# Patient Record
Sex: Male | Born: 1970 | Race: White | Hispanic: No | Marital: Married | Smoking: Never smoker
Health system: Southern US, Community
[De-identification: ages and names within clinical notes are randomized; demographics above are authoritative.]

## PROBLEM LIST (undated history)

## (undated) DIAGNOSIS — T4145XA Adverse effect of unspecified anesthetic, initial encounter: Secondary | ICD-10-CM

## (undated) DIAGNOSIS — K219 Gastro-esophageal reflux disease without esophagitis: Secondary | ICD-10-CM

## (undated) DIAGNOSIS — R51 Headache: Secondary | ICD-10-CM

## (undated) DIAGNOSIS — R519 Headache, unspecified: Secondary | ICD-10-CM

## (undated) DIAGNOSIS — T8859XA Other complications of anesthesia, initial encounter: Secondary | ICD-10-CM

## (undated) HISTORY — PX: ANKLE ARTHODESIS W/ ARTHROSCOPY: SUR63

## (undated) HISTORY — PX: KNEE ARTHROCENTESIS: SUR44

---

## 2005-10-04 ENCOUNTER — Other Ambulatory Visit: Payer: Self-pay

## 2005-10-04 ENCOUNTER — Inpatient Hospital Stay: Payer: Self-pay | Admitting: Internal Medicine

## 2005-10-05 ENCOUNTER — Other Ambulatory Visit: Payer: Self-pay

## 2008-02-22 ENCOUNTER — Emergency Department: Payer: Self-pay | Admitting: Emergency Medicine

## 2008-02-26 ENCOUNTER — Ambulatory Visit: Payer: Self-pay | Admitting: Orthopedic Surgery

## 2008-02-26 ENCOUNTER — Other Ambulatory Visit: Payer: Self-pay

## 2008-02-28 ENCOUNTER — Ambulatory Visit: Payer: Self-pay | Admitting: Orthopedic Surgery

## 2008-02-29 ENCOUNTER — Emergency Department: Payer: Self-pay | Admitting: Emergency Medicine

## 2010-09-23 ENCOUNTER — Ambulatory Visit: Payer: Self-pay | Admitting: Internal Medicine

## 2012-03-16 ENCOUNTER — Ambulatory Visit (HOSPITAL_BASED_OUTPATIENT_CLINIC_OR_DEPARTMENT_OTHER)
Admission: RE | Admit: 2012-03-16 | Discharge: 2012-03-16 | Disposition: A | Discharge status: Home / Self Care | Payer: Self-pay | Source: Ambulatory Visit | Attending: Occupational Medicine | Admitting: Occupational Medicine

## 2012-03-16 ENCOUNTER — Other Ambulatory Visit: Payer: Self-pay | Admitting: Occupational Medicine

## 2012-03-16 DIAGNOSIS — Z0000 Encounter for general adult medical examination without abnormal findings: Secondary | ICD-10-CM | POA: Insufficient documentation

## 2012-07-17 ENCOUNTER — Ambulatory Visit: Payer: Self-pay | Admitting: Internal Medicine

## 2012-07-26 ENCOUNTER — Ambulatory Visit: Payer: Self-pay

## 2014-12-31 ENCOUNTER — Encounter: Payer: Self-pay | Admitting: Emergency Medicine

## 2014-12-31 ENCOUNTER — Ambulatory Visit
Admission: EM | Admit: 2014-12-31 | Discharge: 2014-12-31 | Disposition: A | Discharge status: Home / Self Care | Payer: Commercial Indemnity | Attending: Family Medicine | Admitting: Family Medicine

## 2014-12-31 ENCOUNTER — Ambulatory Visit
Admit: 2014-12-31 | Discharge: 2014-12-31 | Disposition: A | Discharge status: Home / Self Care | Payer: Commercial Indemnity | Attending: Family Medicine | Admitting: Family Medicine

## 2014-12-31 ENCOUNTER — Ambulatory Visit: Payer: Commercial Indemnity

## 2014-12-31 DIAGNOSIS — T148 Other injury of unspecified body region: Secondary | ICD-10-CM

## 2014-12-31 DIAGNOSIS — M79605 Pain in left leg: Secondary | ICD-10-CM | POA: Insufficient documentation

## 2014-12-31 DIAGNOSIS — Z23 Encounter for immunization: Secondary | ICD-10-CM

## 2014-12-31 DIAGNOSIS — T148XXA Other injury of unspecified body region, initial encounter: Secondary | ICD-10-CM

## 2014-12-31 MED ORDER — TETANUS-DIPHTH-ACELL PERTUSSIS 5-2.5-18.5 LF-MCG/0.5 IM SUSP
0.5000 mL | Freq: Once | INTRAMUSCULAR | Status: AC
  Administered 2014-12-31: 0.5 mL via INTRAMUSCULAR

## 2014-12-31 NOTE — ED Notes (Signed)
Patient states " he hit his left lower leg with the pressure washer about 3 weeks ago leaving a large painful knot on his left lower leg which has not gotten any better"

## 2014-12-31 NOTE — ED Provider Notes (Signed)
Patient presents today with symptoms of hematoma to the left lower shin area after pressure washer hit the area approximately 3 weeks ago. Patient states that he is always on his feet and has not had time to rest her elevate the area much. He has noticed the area looking larger and is still slightly tender. He denies any of the skin overlying the area being warm or erythematous. He denies any fever or chills. He has noticed that distal to the area along the ankle he has had some swelling but no pain. He also has had occasional knee and hip pain which are chronic for him but wonders whether the recent pain is a result of the injury to the left shin area. Denies any tingling or numbness, discoloration of the extremity.   Review systems negative except mentioned above Vitals as per chart  GENERAL: NAD RESP: CTA B CARD: RRR  EXTREM: LLE-no erythema or significant warmth of the area, no streaks, approx. 2 in x 2 in area of swelling with a small pinpoint scab in the center, mild tenderness along this area in the mid-distal shin area, FROM, nv intact, minimal swelling around the ankle compared to other side, no knee swelling or tenderness appreciated, -Homans  NEURO: CN II-XII groslly intact   A/P: LLE Contusion/Hematoma-x-ray was done of the area without any evidence of fracture, will do ultrasound to rule out any DVT, patient's distal swelling is likely due to gravity which was explained to the patient, recommend patient ice area and elevated as much as possible if ultrasound is negative for DVT. Will discuss results of ultrasound once I've been called by the radiology tech. A TDAP immunization was  given to the patient prior to discharge from the office here today. He is to seek medical attention if symptoms persist or worsen as discussed.  Paulina Fusi, MD 12/31/14 1530

## 2014-12-31 NOTE — Discharge Instructions (Signed)
Go get ultrasound at this time. If no blood clot recommend more ice on area and elevation of leg. If symptoms continue to persist or worsen seek medical attention. If any signs of infection seek medical attention.

## 2015-01-19 ENCOUNTER — Other Ambulatory Visit: Payer: Self-pay | Admitting: Family Medicine

## 2015-01-19 ENCOUNTER — Encounter: Payer: Self-pay | Admitting: Family Medicine

## 2015-01-19 ENCOUNTER — Ambulatory Visit (INDEPENDENT_AMBULATORY_CARE_PROVIDER_SITE_OTHER): Payer: Commercial Indemnity | Admitting: Family Medicine

## 2015-01-19 ENCOUNTER — Ambulatory Visit
Admission: RE | Admit: 2015-01-19 | Discharge: 2015-01-19 | Disposition: A | Discharge status: Home / Self Care | Payer: Commercial Indemnity | Source: Ambulatory Visit | Attending: Family Medicine | Admitting: Family Medicine

## 2015-01-19 VITALS — BP 122/90 | HR 70 | Ht 69.0 in | Wt 225.0 lb

## 2015-01-19 DIAGNOSIS — M25552 Pain in left hip: Secondary | ICD-10-CM

## 2015-01-19 DIAGNOSIS — M25572 Pain in left ankle and joints of left foot: Secondary | ICD-10-CM | POA: Diagnosis not present

## 2015-01-19 DIAGNOSIS — J0100 Acute maxillary sinusitis, unspecified: Secondary | ICD-10-CM | POA: Diagnosis not present

## 2015-01-19 DIAGNOSIS — G8929 Other chronic pain: Secondary | ICD-10-CM

## 2015-01-19 MED ORDER — AMOXICILLIN-POT CLAVULANATE 875-125 MG PO TABS: 1.0000 | ORAL_TABLET | Freq: Two times a day (BID) | ORAL | Status: DC

## 2015-01-19 MED ORDER — MELOXICAM 15 MG PO TABS: 15.0000 mg | ORAL_TABLET | Freq: Every day | ORAL | Status: DC

## 2015-01-19 NOTE — Progress Notes (Signed)
Name: Michael Wu   MRN: [id number]    DOB: Sep 13, 1970   Date:01/19/2015       Progress Note  Subjective  Chief Complaint  Chief Complaint  Patient presents with  . Sinusitis    clear, thick production    Sinusitis This is a new problem. The current episode started 1 to 4 weeks ago. The problem has been gradually improving since onset. There has been no fever. Associated symptoms include coughing. Pertinent negatives include no chills, congestion, diaphoresis, ear pain, headaches, hoarse voice, neck pain, shortness of breath, sinus pressure, sneezing, sore throat or swollen glands. Past treatments include nothing. The treatment provided mild relief.  Hip Pain  The incident occurred more than 1 week ago. The incident occurred at home. There was no injury mechanism. The pain is present in the left hip. The quality of the pain is described as aching. The pain is moderate. The pain has been intermittent since onset. Pertinent negatives include no inability to bear weight, loss of motion, loss of sensation, muscle weakness, numbness or tingling. The symptoms are aggravated by movement. He has tried acetaminophen and NSAIDs for the symptoms. The treatment provided mild relief.    No problem-specific assessment & plan notes found for this encounter.   History reviewed. No pertinent past medical history.  Past Surgical History  Procedure Laterality Date  . Ankle arthodesis w/ arthroscopy Left   . Knee arthrocentesis Left     Family History  Problem Relation Age of Onset  . Cancer Father   . Heart disease Father     History   Social History  . Marital Status: Married    Spouse Name: N/A  . Number of Children: N/A  . Years of Education: N/A   Occupational History  . Not on file.   Social History Main Topics  . Smoking status: Never Smoker   . Smokeless tobacco: Current User  . Alcohol Use: 0.6 oz/week    1 Cans of beer per week  . Drug Use: Not on file  . Sexual Activity:  Yes   Other Topics Concern  . Not on file   Social History Narrative    No Known Allergies   Review of Systems  Constitutional: Negative for fever, chills, weight loss, malaise/fatigue and diaphoresis.  HENT: Negative for congestion, ear discharge, ear pain, hoarse voice, sinus pressure, sneezing and sore throat.   Eyes: Negative for blurred vision.  Respiratory: Positive for cough. Negative for sputum production, shortness of breath and wheezing.   Cardiovascular: Negative for chest pain, palpitations and leg swelling.  Gastrointestinal: Negative for heartburn, nausea, abdominal pain, diarrhea, constipation, blood in stool and melena.  Genitourinary: Negative for dysuria, urgency, frequency and hematuria.  Musculoskeletal: Negative for myalgias, back pain, joint pain and neck pain.  Skin: Negative for rash.  Neurological: Negative for dizziness, tingling, sensory change, focal weakness, numbness and headaches.  Endo/Heme/Allergies: Negative for environmental allergies and polydipsia. Does not bruise/bleed easily.  Psychiatric/Behavioral: Negative for depression and suicidal ideas. The patient is not nervous/anxious and does not have insomnia.      Objective  Filed Vitals:   01/19/15 1347  BP: 122/90  Pulse: 70  Height: 5\' 9"  (1.753 m)  Weight: 225 lb (102.059 kg)    Physical Exam  Constitutional: He is oriented to person, place, and time and well-developed, well-nourished, and in no distress.  HENT:  Head: Normocephalic.  Right Ear: External ear normal.  Left Ear: External ear normal.  Nose: Nose normal.  Mouth/Throat: Oropharynx is clear and moist.  Eyes: Conjunctivae and EOM are normal. Pupils are equal, round, and reactive to light. Right eye exhibits no discharge. Left eye exhibits no discharge. No scleral icterus.  Neck: Normal range of motion. Neck supple. No JVD present. No tracheal deviation present. No thyromegaly present.  Cardiovascular: Normal rate, regular  rhythm, normal heart sounds and intact distal pulses.  Exam reveals no gallop and no friction rub.   No murmur heard. Pulmonary/Chest: Breath sounds normal. No respiratory distress. He has no wheezes. He has no rales.  Abdominal: Soft. Bowel sounds are normal. He exhibits no mass. There is no hepatosplenomegaly. There is no tenderness. There is no rebound, no guarding and no CVA tenderness.  Musculoskeletal: Normal range of motion. He exhibits no edema or tenderness.  Lymphadenopathy:    He has no cervical adenopathy.  Neurological: He is alert and oriented to person, place, and time. He has normal sensation, normal strength, normal reflexes and intact cranial nerves. No cranial nerve deficit.  Skin: Skin is warm. No rash noted.  Psychiatric: Mood and affect normal.      Assessment & Plan  Problem List Items Addressed This Visit    None    Visit Diagnoses    Acute maxillary sinusitis, recurrence not specified    -  Primary    Relevant Medications    amoxicillin-clavulanate (AUGMENTIN) 875-125 MG per tablet    Hip pain, left        Relevant Medications    meloxicam (MOBIC) 15 MG tablet    Other Relevant Orders    DG HIP UNILAT WITH PELVIS 1V LEFT    Ankle pain, chronic, left        Relevant Medications    meloxicam (MOBIC) 15 MG tablet    Other Relevant Orders    DG Ankle Complete Left         Dr. Deanna Jones [facility]  01/19/2015

## 2015-01-22 ENCOUNTER — Other Ambulatory Visit: Payer: Self-pay

## 2015-01-22 DIAGNOSIS — M25572 Pain in left ankle and joints of left foot: Principal | ICD-10-CM

## 2015-01-22 DIAGNOSIS — G8929 Other chronic pain: Secondary | ICD-10-CM

## 2015-02-23 ENCOUNTER — Encounter: Payer: Self-pay | Admitting: Family Medicine

## 2015-02-23 ENCOUNTER — Ambulatory Visit (INDEPENDENT_AMBULATORY_CARE_PROVIDER_SITE_OTHER): Payer: Commercial Indemnity | Admitting: Family Medicine

## 2015-02-23 VITALS — BP 130/80 | HR 80 | Ht 69.0 in | Wt 225.0 lb

## 2015-02-23 DIAGNOSIS — J45909 Unspecified asthma, uncomplicated: Secondary | ICD-10-CM

## 2015-02-23 DIAGNOSIS — R05 Cough: Secondary | ICD-10-CM | POA: Diagnosis not present

## 2015-02-23 DIAGNOSIS — R0982 Postnasal drip: Secondary | ICD-10-CM

## 2015-02-23 DIAGNOSIS — R054 Cough syncope: Secondary | ICD-10-CM | POA: Insufficient documentation

## 2015-02-23 DIAGNOSIS — J309 Allergic rhinitis, unspecified: Secondary | ICD-10-CM

## 2015-02-23 MED ORDER — MONTELUKAST SODIUM 10 MG PO TABS: 10.0000 mg | ORAL_TABLET | Freq: Every day | ORAL | Status: DC

## 2015-02-23 NOTE — Progress Notes (Signed)
Name: Michael Wu   MRN: [id number]    DOB: 1970-11-12   Date:02/23/2015       Progress Note  Subjective  Chief Complaint  Chief Complaint  Patient presents with  . Cough    finished round of Augmentin on 01/29/15    Cough This is a recurrent problem. The current episode started 1 to 4 weeks ago. The problem has been gradually improving (one episode of syncopy). The cough is non-productive. Associated symptoms include nasal congestion, postnasal drip and rhinorrhea. Pertinent negatives include no chest pain, chills, ear congestion, ear pain, fever, headaches, heartburn, hemoptysis, myalgias, rash, sore throat, shortness of breath, sweats, weight loss or wheezing. Nothing aggravates the symptoms. There is no history of asthma, bronchiectasis, bronchitis, COPD, emphysema, environmental allergies or pneumonia.    No problem-specific assessment & plan notes found for this encounter.   No past medical history on file.  Past Surgical History  Procedure Laterality Date  . Ankle arthodesis w/ arthroscopy Left   . Knee arthrocentesis Left     Family History  Problem Relation Age of Onset  . Cancer Father   . Heart disease Father     Social History   Social History  . Marital Status: Married    Spouse Name: N/A  . Number of Children: N/A  . Years of Education: N/A   Occupational History  . Not on file.   Social History Main Topics  . Smoking status: Never Smoker   . Smokeless tobacco: Current User  . Alcohol Use: 0.6 oz/week    1 Cans of beer per week  . Drug Use: Not on file  . Sexual Activity: Yes   Other Topics Concern  . Not on file   Social History Narrative    No Known Allergies   Review of Systems  Constitutional: Negative for fever, chills and weight loss.  HENT: Positive for postnasal drip and rhinorrhea. Negative for ear pain and sore throat.   Respiratory: Positive for cough. Negative for hemoptysis, shortness of breath and wheezing.    Cardiovascular: Negative for chest pain.  Gastrointestinal: Negative for heartburn.  Musculoskeletal: Negative for myalgias.  Skin: Negative for rash.  Neurological: Negative for headaches.  Endo/Heme/Allergies: Negative for environmental allergies.     Objective  Filed Vitals:   02/23/15 1352  BP: 130/80  Pulse: 80  Height: 5\' 9"  (1.753 m)  Weight: 225 lb (102.059 kg)    Physical Exam    Assessment & Plan  Problem List Items Addressed This Visit      Cardiovascular and Mediastinum   Cough syncope syndrome   Relevant Orders   Ambulatory referral to ENT    Other Visit Diagnoses    Allergic rhinitis with postnasal drip    -  Primary    Relevant Medications    montelukast (SINGULAIR) 10 MG tablet    Other Relevant Orders    Ambulatory referral to ENT         Dr. Otilio Miu [facility]  02/23/2015

## 2015-11-19 ENCOUNTER — Encounter: Payer: Self-pay | Admitting: Family Medicine

## 2015-11-19 ENCOUNTER — Ambulatory Visit (INDEPENDENT_AMBULATORY_CARE_PROVIDER_SITE_OTHER): Payer: Commercial Indemnity | Admitting: Family Medicine

## 2015-11-19 VITALS — BP 120/80 | HR 82 | Ht 69.0 in | Wt 223.0 lb

## 2015-11-19 DIAGNOSIS — S8391XS Sprain of unspecified site of right knee, sequela: Secondary | ICD-10-CM | POA: Diagnosis not present

## 2015-11-19 DIAGNOSIS — S838X1S Sprain of other specified parts of right knee, sequela: Secondary | ICD-10-CM

## 2015-11-19 DIAGNOSIS — M175 Other unilateral secondary osteoarthritis of knee: Secondary | ICD-10-CM | POA: Diagnosis not present

## 2015-11-19 MED ORDER — MELOXICAM 15 MG PO TABS: 15.0000 mg | ORAL_TABLET | Freq: Every day | ORAL | Status: DC

## 2015-11-19 NOTE — Progress Notes (Signed)
Name: Michael Wu   MRN: [id number]    DOB: 01-Jun-1971   Date:11/19/2015       Progress Note  Subjective  Chief Complaint  Chief Complaint  Patient presents with  . Knee Pain    R) knee pain x couple of months- some days are worse than others/ driving aggravates it    Knee Pain  The incident occurred more than 1 week ago. There was no injury mechanism. The pain is present in the right knee. The pain is at a severity of 7/10. The pain is moderate. The pain has been fluctuating since onset. Pertinent negatives include no inability to bear weight, loss of motion, loss of sensation, muscle weakness, numbness or tingling. The symptoms are aggravated by movement and weight bearing. He has tried acetaminophen and NSAIDs for the symptoms. The treatment provided no relief.    No problem-specific assessment & plan notes found for this encounter.   History reviewed. No pertinent past medical history.  Past Surgical History  Procedure Laterality Date  . Ankle arthodesis w/ arthroscopy Left   . Knee arthrocentesis Left     Family History  Problem Relation Age of Onset  . Cancer Father   . Heart disease Father     Social History   Social History  . Marital Status: Married    Spouse Name: N/A  . Number of Children: N/A  . Years of Education: N/A   Occupational History  . Not on file.   Social History Main Topics  . Smoking status: Never Smoker   . Smokeless tobacco: Current User  . Alcohol Use: 0.6 oz/week    1 Cans of beer per week  . Drug Use: Not on file  . Sexual Activity: Yes   Other Topics Concern  . Not on file   Social History Narrative    No Known Allergies   Review of Systems  Constitutional: Negative for fever, chills, weight loss and malaise/fatigue.  HENT: Negative for ear discharge, ear pain and sore throat.   Eyes: Negative for blurred vision.  Respiratory: Negative for cough, sputum production, shortness of breath and wheezing.   Cardiovascular:  Negative for chest pain, palpitations and leg swelling.  Gastrointestinal: Negative for heartburn, nausea, abdominal pain, diarrhea, constipation, blood in stool and melena.  Genitourinary: Negative for dysuria, urgency, frequency and hematuria.  Musculoskeletal: Negative for myalgias, back pain, joint pain and neck pain.  Skin: Negative for rash.  Neurological: Negative for dizziness, tingling, sensory change, focal weakness, numbness and headaches.  Endo/Heme/Allergies: Negative for environmental allergies and polydipsia. Does not bruise/bleed easily.  Psychiatric/Behavioral: Negative for depression and suicidal ideas. The patient is not nervous/anxious and does not have insomnia.      Objective  Filed Vitals:   11/19/15 0853  BP: 120/80  Pulse: 82  Height: 5\' 9"  (1.753 m)  Weight: 223 lb (101.152 kg)    Physical Exam  Constitutional: He is oriented to person, place, and time and well-developed, well-nourished, and in no distress.  HENT:  Head: Normocephalic.  Right Ear: External ear normal.  Left Ear: External ear normal.  Nose: Nose normal.  Mouth/Throat: Oropharynx is clear and moist.  Eyes: Conjunctivae and EOM are normal. Pupils are equal, round, and reactive to light. Right eye exhibits no discharge. Left eye exhibits no discharge. No scleral icterus.  Neck: Normal range of motion. Neck supple. No JVD present. No tracheal deviation present. No thyromegaly present.  Cardiovascular: Normal rate, regular rhythm, normal heart sounds and intact distal  pulses.  Exam reveals no gallop and no friction rub.   No murmur heard. Pulmonary/Chest: Breath sounds normal. No respiratory distress. He has no wheezes. He has no rales.  Abdominal: Soft. Bowel sounds are normal. He exhibits no mass. There is no hepatosplenomegaly. There is no tenderness. There is no rebound, no guarding and no CVA tenderness.  Musculoskeletal: Normal range of motion. He exhibits no edema.       Right knee: He  exhibits normal range of motion. Tenderness found. Medial joint line and lateral joint line tenderness noted. No MCL, no LCL and no patellar tendon tenderness noted.  popleteal tenderness  Lymphadenopathy:    He has no cervical adenopathy.  Neurological: He is alert and oriented to person, place, and time. He has normal sensation, normal strength, normal reflexes and intact cranial nerves. No cranial nerve deficit.  Skin: Skin is warm. No rash noted.  Psychiatric: Mood and affect normal.  Nursing note and vitals reviewed.     Assessment & Plan  Problem List Items Addressed This Visit    None    Visit Diagnoses    Other secondary osteoarthritis of one knee    -  Primary    surgery 15 years ago    Relevant Medications    meloxicam (MOBIC) 15 MG tablet    Meniscal injury, right, sequela        next step ortho eval    Relevant Medications    meloxicam (MOBIC) 15 MG tablet         Dr. Mac[facility]  11/19/2015

## 2015-12-29 ENCOUNTER — Ambulatory Visit (INDEPENDENT_AMBULATORY_CARE_PROVIDER_SITE_OTHER): Payer: Managed Care, Other (non HMO) | Admitting: Family Medicine

## 2015-12-29 ENCOUNTER — Encounter: Payer: Self-pay | Admitting: Family Medicine

## 2015-12-29 VITALS — BP 120/80 | HR 64 | Ht 69.0 in | Wt 220.0 lb

## 2015-12-29 DIAGNOSIS — J0100 Acute maxillary sinusitis, unspecified: Secondary | ICD-10-CM | POA: Diagnosis not present

## 2015-12-29 DIAGNOSIS — S838X1D Sprain of other specified parts of right knee, subsequent encounter: Secondary | ICD-10-CM

## 2015-12-29 MED ORDER — AZITHROMYCIN 250 MG PO TABS: ORAL_TABLET | ORAL | Status: DC

## 2015-12-29 NOTE — Progress Notes (Signed)
Name: Michael Wu   MRN: [id number]    DOB: July 01, 1971   Date:12/29/2015       Progress Note  Subjective  Chief Complaint  Chief Complaint  Patient presents with  . Sinusitis    cough and cong, drainage- yellow production  . Knee Pain    R) knee pain- quit taking Meloxicam "didn't feel like it was helping"- wants referral to ortho    Sinusitis This is a new problem. The current episode started in the past 7 days. The problem has been gradually improving since onset. There has been no fever. Associated symptoms include congestion and coughing. Pertinent negatives include no chills, diaphoresis, ear pain, headaches, hoarse voice, neck pain, shortness of breath, sinus pressure, sneezing, sore throat or swollen glands. The treatment provided mild relief.  Knee Pain  There was no injury mechanism (progreeive worse). The pain is present in the right knee. The quality of the pain is described as aching. The pain is at a severity of 5/10. The pain is moderate. The pain has been constant since onset. Pertinent negatives include no tingling. The treatment provided mild relief.    No problem-specific assessment & plan notes found for this encounter.   No past medical history on file.  Past Surgical History  Procedure Laterality Date  . Ankle arthodesis w/ arthroscopy Left   . Knee arthrocentesis Left     Family History  Problem Relation Age of Onset  . Cancer Father   . Heart disease Father     Social History   Social History  . Marital Status: Married    Spouse Name: N/A  . Number of Children: N/A  . Years of Education: N/A   Occupational History  . Not on file.   Social History Main Topics  . Smoking status: Never Smoker   . Smokeless tobacco: Current User  . Alcohol Use: 0.6 oz/week    1 Cans of beer per week  . Drug Use: Not on file  . Sexual Activity: Yes   Other Topics Concern  . Not on file   Social History Narrative    No Known Allergies   Review of  Systems  Constitutional: Negative for fever, chills, weight loss, malaise/fatigue and diaphoresis.  HENT: Positive for congestion. Negative for ear discharge, ear pain, hoarse voice, sinus pressure, sneezing and sore throat.   Eyes: Negative for blurred vision.  Respiratory: Positive for cough. Negative for sputum production, shortness of breath and wheezing.   Cardiovascular: Negative for chest pain, palpitations and leg swelling.  Gastrointestinal: Negative for heartburn, nausea, abdominal pain, diarrhea, constipation, blood in stool and melena.  Genitourinary: Negative for dysuria, urgency, frequency and hematuria.  Musculoskeletal: Negative for myalgias, back pain, joint pain and neck pain.  Skin: Negative for rash.  Neurological: Negative for dizziness, tingling, sensory change, focal weakness and headaches.  Endo/Heme/Allergies: Negative for environmental allergies and polydipsia. Does not bruise/bleed easily.  Psychiatric/Behavioral: Negative for depression and suicidal ideas. The patient is not nervous/anxious and does not have insomnia.      Objective  Filed Vitals:   12/29/15 1431  BP: 120/80  Pulse: 64  Height: 5\' 9"  (1.753 m)  Weight: 220 lb (99.791 kg)    Physical Exam  Constitutional: He is oriented to person, place, and time and well-developed, well-nourished, and in no distress.  HENT:  Head: Normocephalic.  Right Ear: External ear normal.  Left Ear: External ear normal.  Nose: Nose normal.  Mouth/Throat: Oropharynx is clear and moist.  Eyes:  Conjunctivae and EOM are normal. Pupils are equal, round, and reactive to light. Right eye exhibits no discharge. Left eye exhibits no discharge. No scleral icterus.  Neck: Normal range of motion. Neck supple. No JVD present. No tracheal deviation present. No thyromegaly present.  Cardiovascular: Normal rate, regular rhythm, normal heart sounds and intact distal pulses.  Exam reveals no gallop and no friction rub.   No murmur  heard. Pulmonary/Chest: Breath sounds normal. No respiratory distress. He has no wheezes. He has no rales.  Abdominal: Soft. Bowel sounds are normal. He exhibits no mass. There is no hepatosplenomegaly. There is no tenderness. There is no rebound, no guarding and no CVA tenderness.  Musculoskeletal: Normal range of motion. He exhibits no edema.       Right knee: He exhibits abnormal meniscus. Tenderness found. Medial joint line tenderness noted. No lateral joint line, no MCL and no LCL tenderness noted.  Lymphadenopathy:    He has no cervical adenopathy.  Neurological: He is alert and oriented to person, place, and time. He has normal sensation, normal strength, normal reflexes and intact cranial nerves. No cranial nerve deficit.  Skin: Skin is warm. No rash noted.  Psychiatric: Mood and affect normal.  Nursing note and vitals reviewed.     Assessment & Plan  Problem List Items Addressed This Visit    None    Visit Diagnoses    Acute maxillary sinusitis, recurrence not specified    -  Primary    Relevant Medications    azithromycin (ZITHROMAX) 250 MG tablet    Meniscal injury, right, subsequent encounter        Relevant Orders    Ambulatory referral to Orthopedic Surgery         Dr. Otilio Miu [facility]  12/29/2015

## 2016-02-09 ENCOUNTER — Other Ambulatory Visit: Payer: Self-pay | Admitting: Family Medicine

## 2016-02-09 DIAGNOSIS — J309 Allergic rhinitis, unspecified: Secondary | ICD-10-CM

## 2016-02-09 DIAGNOSIS — R0982 Postnasal drip: Principal | ICD-10-CM

## 2016-02-10 ENCOUNTER — Other Ambulatory Visit: Payer: Self-pay | Admitting: Orthopedic Surgery

## 2016-02-10 DIAGNOSIS — M2391 Unspecified internal derangement of right knee: Secondary | ICD-10-CM

## 2016-02-10 DIAGNOSIS — M25561 Pain in right knee: Secondary | ICD-10-CM

## 2016-02-19 ENCOUNTER — Ambulatory Visit
Admission: RE | Admit: 2016-02-19 | Discharge: 2016-02-19 | Disposition: A | Discharge status: Home / Self Care | Payer: Managed Care, Other (non HMO) | Source: Ambulatory Visit | Attending: Orthopedic Surgery | Admitting: Orthopedic Surgery

## 2016-02-19 DIAGNOSIS — M2391 Unspecified internal derangement of right knee: Secondary | ICD-10-CM

## 2016-02-19 DIAGNOSIS — X58XXXA Exposure to other specified factors, initial encounter: Secondary | ICD-10-CM | POA: Insufficient documentation

## 2016-02-19 DIAGNOSIS — M25561 Pain in right knee: Secondary | ICD-10-CM | POA: Diagnosis present

## 2016-02-19 DIAGNOSIS — S83281A Other tear of lateral meniscus, current injury, right knee, initial encounter: Secondary | ICD-10-CM | POA: Insufficient documentation

## 2016-03-07 ENCOUNTER — Other Ambulatory Visit: Payer: Self-pay

## 2016-03-16 ENCOUNTER — Encounter
Admission: RE | Admit: 2016-03-16 | Discharge: 2016-03-16 | Disposition: A | Discharge status: Home / Self Care | Payer: Commercial Indemnity | Source: Ambulatory Visit | Attending: Surgery | Admitting: Surgery

## 2016-03-16 HISTORY — DX: Headache: R51

## 2016-03-16 HISTORY — DX: Adverse effect of unspecified anesthetic, initial encounter: T41.45XA

## 2016-03-16 HISTORY — DX: Headache, unspecified: R51.9

## 2016-03-16 HISTORY — DX: Other complications of anesthesia, initial encounter: T88.59XA

## 2016-03-16 HISTORY — DX: Gastro-esophageal reflux disease without esophagitis: K21.9

## 2016-03-16 NOTE — Patient Instructions (Signed)
  Your procedure is scheduled on: 03-24-16 Report to Same Day Surgery 2nd floor medical mall To find out your arrival time please call [phone] between 1PM - 3PM on 03-23-16  Remember: Instructions that are not followed completely may result in serious medical risk, up to and including death, or upon the discretion of your surgeon and anesthesiologist your surgery may need to be rescheduled.    _x___ 1. Do not eat food or drink liquids after midnight. No gum chewing or hard candies.     __x__ 2. No Alcohol for 24 hours before or after surgery.   __x__3. No Smoking for 24 prior to surgery-DO NOT CHEW TOBACCO AFTER MIDNIGHT    ____  4. Bring all medications with you on the day of surgery if instructed.    __x__ 5. Notify your doctor if there is any change in your medical condition     (cold, fever, infections).     Do not wear jewelry, make-up, hairpins, clips or nail polish.  Do not wear lotions, powders, or perfumes. You may wear deodorant.  Do not shave 48 hours prior to surgery. Men may shave face and neck.  Do not bring valuables to the hospital.    [facility] is not responsible for any belongings or valuables.               Contacts, dentures or bridgework may not be worn into surgery.  Leave your suitcase in the car. After surgery it may be brought to your room.  For patients admitted to the hospital, discharge time is determined by your treatment team.   Patients discharged the day of surgery will not be allowed to drive home.    Please read over the following fact sheets that you were given:   [facility] Preparing for Surgery and or MRSA Information   ____ Take these medicines the morning of surgery with A SIP OF WATER:    1. NONE  2.  3.  4.  5.  6.  ____ Fleet Enema (as directed)   ____ Use CHG Soap or sage wipes as directed on instruction sheet   ____ Use inhalers on the day of surgery and bring to hospital day of surgery  ____ Stop metformin 2 days  prior to surgery    ____ Take 1/2 of usual insulin dose the night before surgery and none on the morning of  surgery.   ____ Stop aspirin or coumadin, or plavix  _x__ Stop Anti-inflammatories such as Advil, Aleve, Ibuprofen, Motrin, Naproxen,          Naprosyn, Goodies powders or aspirin products NOW-Ok to take Tylenol.   ____ Stop supplements until after surgery.    ____ Bring C-Pap to the hospital.

## 2016-03-24 ENCOUNTER — Ambulatory Visit: Payer: Managed Care, Other (non HMO) | Admitting: Anesthesiology

## 2016-03-24 ENCOUNTER — Encounter
Admission: RE | Disposition: A | Discharge status: Home / Self Care | Payer: Self-pay | Source: Ambulatory Visit | Attending: Surgery

## 2016-03-24 ENCOUNTER — Ambulatory Visit
Admission: RE | Admit: 2016-03-24 | Discharge: 2016-03-24 | Disposition: A | Discharge status: Home / Self Care | Payer: Managed Care, Other (non HMO) | Source: Ambulatory Visit | Attending: Surgery | Admitting: Surgery

## 2016-03-24 ENCOUNTER — Encounter: Payer: Self-pay | Admitting: *Deleted

## 2016-03-24 DIAGNOSIS — M23261 Derangement of other lateral meniscus due to old tear or injury, right knee: Secondary | ICD-10-CM | POA: Insufficient documentation

## 2016-03-24 DIAGNOSIS — Z6832 Body mass index (BMI) 32.0-32.9, adult: Secondary | ICD-10-CM | POA: Diagnosis not present

## 2016-03-24 DIAGNOSIS — Z825 Family history of asthma and other chronic lower respiratory diseases: Secondary | ICD-10-CM | POA: Insufficient documentation

## 2016-03-24 DIAGNOSIS — Z79899 Other long term (current) drug therapy: Secondary | ICD-10-CM | POA: Diagnosis not present

## 2016-03-24 DIAGNOSIS — Z720 Tobacco use: Secondary | ICD-10-CM | POA: Insufficient documentation

## 2016-03-24 DIAGNOSIS — E669 Obesity, unspecified: Secondary | ICD-10-CM | POA: Insufficient documentation

## 2016-03-24 HISTORY — PX: KNEE ARTHROSCOPY WITH MENISCAL REPAIR: SHX5653

## 2016-03-24 SURGERY — ARTHROSCOPY, KNEE, WITH MENISCUS REPAIR
Anesthesia: General | Laterality: Right

## 2016-03-24 MED ORDER — CEFAZOLIN SODIUM-DEXTROSE 2-4 GM/100ML-% IV SOLN
INTRAVENOUS | Status: AC
  Administered 2016-03-24: 2 g via INTRAVENOUS
  Filled 2016-03-24: qty 100

## 2016-03-24 MED ORDER — LACTATED RINGERS IV SOLN
INTRAVENOUS | Status: DC
  Administered 2016-03-24 (×2): via INTRAVENOUS

## 2016-03-24 MED ORDER — PROMETHAZINE HCL 25 MG/ML IJ SOLN: 6.2500 mg | INTRAMUSCULAR | Status: DC | PRN

## 2016-03-24 MED ORDER — LIDOCAINE HCL 1 % IJ SOLN
INTRAMUSCULAR | Status: DC | PRN
  Administered 2016-03-24: 30 mL

## 2016-03-24 MED ORDER — MIDAZOLAM HCL 5 MG/5ML IJ SOLN
INTRAMUSCULAR | Status: DC | PRN
  Administered 2016-03-24: 2 mg via INTRAVENOUS

## 2016-03-24 MED ORDER — OXYCODONE HCL 5 MG PO TABS: 5.0000 mg | ORAL_TABLET | Freq: Once | ORAL | Status: DC | PRN

## 2016-03-24 MED ORDER — BUPIVACAINE-EPINEPHRINE (PF) 0.5% -1:200000 IJ SOLN
INTRAMUSCULAR | Status: AC
  Filled 2016-03-24: qty 60

## 2016-03-24 MED ORDER — FENTANYL CITRATE (PF) 100 MCG/2ML IJ SOLN
INTRAMUSCULAR | Status: DC | PRN
  Administered 2016-03-24 (×4): 25 ug via INTRAVENOUS

## 2016-03-24 MED ORDER — KETOROLAC TROMETHAMINE 30 MG/ML IJ SOLN
INTRAMUSCULAR | Status: DC | PRN
  Administered 2016-03-24: 30 mg via INTRAVENOUS

## 2016-03-24 MED ORDER — MEPERIDINE HCL 25 MG/ML IJ SOLN: 6.2500 mg | INTRAMUSCULAR | Status: DC | PRN

## 2016-03-24 MED ORDER — BUPIVACAINE-EPINEPHRINE (PF) 0.5% -1:200000 IJ SOLN
INTRAMUSCULAR | Status: DC | PRN
  Administered 2016-03-24: 30 mL via PERINEURAL

## 2016-03-24 MED ORDER — OXYCODONE HCL 5 MG/5ML PO SOLN: 5.0000 mg | Freq: Once | ORAL | Status: DC | PRN

## 2016-03-24 MED ORDER — FENTANYL CITRATE (PF) 100 MCG/2ML IJ SOLN: 25.0000 ug | INTRAMUSCULAR | Status: DC | PRN

## 2016-03-24 MED ORDER — ONDANSETRON HCL 4 MG/2ML IJ SOLN
INTRAMUSCULAR | Status: DC | PRN
  Administered 2016-03-24: 4 mg via INTRAVENOUS

## 2016-03-24 MED ORDER — FAMOTIDINE 20 MG PO TABS
20.0000 mg | ORAL_TABLET | Freq: Once | ORAL | Status: AC
  Administered 2016-03-24: 20 mg via ORAL

## 2016-03-24 MED ORDER — FAMOTIDINE 20 MG PO TABS
ORAL_TABLET | ORAL | Status: AC
  Administered 2016-03-24: 20 mg via ORAL
  Filled 2016-03-24: qty 1

## 2016-03-24 MED ORDER — CEFAZOLIN SODIUM-DEXTROSE 2-3 GM-% IV SOLR
INTRAVENOUS | Status: DC | PRN
  Administered 2016-03-24: 2 g via INTRAVENOUS

## 2016-03-24 MED ORDER — BUPIVACAINE-EPINEPHRINE (PF) 0.5% -1:200000 IJ SOLN
INTRAMUSCULAR | Status: DC | PRN
  Administered 2016-03-24: 20 mL

## 2016-03-24 MED ORDER — DEXAMETHASONE SODIUM PHOSPHATE 10 MG/ML IJ SOLN
INTRAMUSCULAR | Status: DC | PRN
  Administered 2016-03-24: 5 mg via INTRAVENOUS

## 2016-03-24 MED ORDER — PROPOFOL 10 MG/ML IV BOLUS
INTRAVENOUS | Status: DC | PRN
  Administered 2016-03-24: 200 mg via INTRAVENOUS

## 2016-03-24 MED ORDER — LACTATED RINGERS IR SOLN
Status: DC | PRN
  Administered 2016-03-24: 2400 mL

## 2016-03-24 MED ORDER — LIDOCAINE HCL (PF) 1 % IJ SOLN
INTRAMUSCULAR | Status: AC
  Filled 2016-03-24: qty 30

## 2016-03-24 MED ORDER — LIDOCAINE HCL (CARDIAC) 20 MG/ML IV SOLN
INTRAVENOUS | Status: DC | PRN
  Administered 2016-03-24: 60 mg via INTRAVENOUS

## 2016-03-24 MED ORDER — CEFAZOLIN SODIUM-DEXTROSE 2-4 GM/100ML-% IV SOLN
2.0000 g | Freq: Once | INTRAVENOUS | Status: AC
  Administered 2016-03-24 (×2): 2 g via INTRAVENOUS

## 2016-03-24 MED ORDER — HYDROCODONE-ACETAMINOPHEN 5-325 MG PO TABS: 1.0000 | ORAL_TABLET | Freq: Four times a day (QID) | ORAL | 0 refills | Status: DC | PRN

## 2016-03-24 SURGICAL SUPPLY — 36 items
BAG COUNTER SPONGE EZ (MISCELLANEOUS) IMPLANT
BANDAGE ACE 6X5 VEL STRL LF (GAUZE/BANDAGES/DRESSINGS) ×3 IMPLANT
BLADE FULL RADIUS 3.5 (BLADE) ×3 IMPLANT
BLADE SHAVER 4.5X7 STR FR (MISCELLANEOUS) IMPLANT
CHLORAPREP W/TINT 26ML (MISCELLANEOUS) ×3 IMPLANT
COUNTER SPONGE BAG EZ (MISCELLANEOUS)
CUFF TOURN 24 STER (MISCELLANEOUS) IMPLANT
CUFF TOURN 30 STER DUAL PORT (MISCELLANEOUS) ×3 IMPLANT
DRAPE IMP U-DRAPE 54X76 (DRAPES) ×3 IMPLANT
ELECT REM PT RETURN 9FT ADLT (ELECTROSURGICAL) ×3
ELECTRODE REM PT RTRN 9FT ADLT (ELECTROSURGICAL) ×1 IMPLANT
GAUZE SPONGE 4X4 12PLY STRL (GAUZE/BANDAGES/DRESSINGS) ×3 IMPLANT
GLOVE BIO SURGEON STRL SZ8 (GLOVE) ×6 IMPLANT
GLOVE BIOGEL M 7.0 STRL (GLOVE) ×3 IMPLANT
GLOVE BIOGEL PI IND STRL 7.5 (GLOVE) ×2 IMPLANT
GLOVE BIOGEL PI INDICATOR 7.5 (GLOVE) ×4
GLOVE INDICATOR 8.0 STRL GRN (GLOVE) ×3 IMPLANT
GOWN STRL REUS W/ TWL LRG LVL3 (GOWN DISPOSABLE) ×1 IMPLANT
GOWN STRL REUS W/ TWL XL LVL3 (GOWN DISPOSABLE) ×2 IMPLANT
GOWN STRL REUS W/TWL LRG LVL3 (GOWN DISPOSABLE) ×2
GOWN STRL REUS W/TWL XL LVL3 (GOWN DISPOSABLE) ×4
IV LACTATED RINGER IRRG 3000ML (IV SOLUTION) ×2
IV LR IRRIG 3000ML ARTHROMATIC (IV SOLUTION) ×1 IMPLANT
KIT RM TURNOVER STRD PROC AR (KITS) ×3 IMPLANT
MANIFOLD NEPTUNE II (INSTRUMENTS) ×3 IMPLANT
NDL SAFETY ECLIPSE 18X1.5 (NEEDLE) ×1 IMPLANT
NEEDLE HYPO 18GX1.5 SHARP (NEEDLE) ×2
NEEDLE HYPO 21X1.5 SAFETY (NEEDLE) ×3 IMPLANT
PACK ARTHROSCOPY KNEE (MISCELLANEOUS) ×3 IMPLANT
PENCIL ELECTRO HAND CTR (MISCELLANEOUS) ×3 IMPLANT
SUT PROLENE 4 0 PS 2 18 (SUTURE) ×3 IMPLANT
SUT TI-CRON 2-0 W/10 SWGD (SUTURE) IMPLANT
SYR 50ML LL SCALE MARK (SYRINGE) ×3 IMPLANT
TUBING ARTHRO INFLOW-ONLY STRL (TUBING) ×3 IMPLANT
WAND COVAC 50 IFS (MISCELLANEOUS) ×3 IMPLANT
WAND HAND CNTRL MULTIVAC 90 (MISCELLANEOUS) IMPLANT

## 2016-03-24 NOTE — H&P (Signed)
Paper H&P to be scanned into permanent record. H&P reviewed. No changes. 

## 2016-03-24 NOTE — Anesthesia Preprocedure Evaluation (Signed)
Anesthesia Evaluation  Patient identified by MRN, date of birth, ID band Patient awake  General Assessment Comment:Pt had an anaphylactic reaction 24h after previous surgery, required steriods and epi, monitored in ICU but did not require intubation. Has had several reactions since with hives where he has used the epi pen. Was not taking any medications after surgery. Unclear what reaction was from, has not had definitive allergy testing.   Reviewed: Allergy & Precautions, NPO status   Airway Mallampati: III  TM Distance: >3 FB Neck ROM: Full    Dental  (+) Poor Dentition   Pulmonary neg pulmonary ROS, neg sleep apnea, neg COPD,    breath sounds clear to auscultation- rhonchi (-) wheezing      Cardiovascular Exercise Tolerance: Good (-) hypertension(-) CAD and (-) Past MI  Rhythm:Regular Rate:Normal - Systolic murmurs and - Diastolic murmurs    Neuro/Psych  Headaches, negative psych ROS   GI/Hepatic Neg liver ROS, GERD  ,  Endo/Other  negative endocrine ROSneg diabetes  Renal/GU negative Renal ROS     Musculoskeletal negative musculoskeletal ROS (+)   Abdominal (+) + obese,   Peds  Hematology negative hematology ROS (+)   Anesthesia Other Findings   Reproductive/Obstetrics                             Anesthesia Physical Anesthesia Plan  ASA: II  Anesthesia Plan: General   Post-op Pain Management:    Induction: Intravenous  Airway Management Planned: LMA  Additional Equipment:   Intra-op Plan:   Post-operative Plan:   Informed Consent: I have reviewed the patients History and Physical, chart, labs and discussed the procedure including the risks, benefits and alternatives for the proposed anesthesia with the patient or authorized representative who has indicated his/her understanding and acceptance.   Dental advisory given  Plan Discussed with: CRNA and  Anesthesiologist  Anesthesia Plan Comments: (Discussed that the most common allergic reactions to medications are to antibiotics and neuromuscular blockers. Will not require neuromuscular blockade for today's procedure. Will be getting antibiotics. Previous reaction was 24h after surgery and precipitating agent is unknown. Discussed with patient and spouse that if he does develop a delayed reaction after discharge that he should immediately use his epi pen and come in to the emergency room. )        Anesthesia Quick Evaluation

## 2016-03-24 NOTE — Anesthesia Postprocedure Evaluation (Signed)
Anesthesia Post Note  Patient: Michael Wu  Procedure(s) Performed: Procedure(s) (LRB): KNEE ARTHROSCOPY WITH PARTIAL LATERAL MENISECTOMY (Right)  Patient location during evaluation: PACU Anesthesia Type: General Level of consciousness: awake and alert Pain management: pain level controlled Vital Signs Assessment: post-procedure vital signs reviewed and stable Respiratory status: spontaneous breathing, nonlabored ventilation, respiratory function stable and patient connected to nasal cannula oxygen Cardiovascular status: blood pressure returned to baseline and stable Postop Assessment: no signs of nausea or vomiting Anesthetic complications: no    Last Vitals:  Vitals:   03/24/16 1754 03/24/16 1812  BP: 118/81 128/80  Pulse: (!) 57 60  Resp: 16 16  Temp: 36.4 C     Last Pain:  Vitals:   03/24/16 1812  TempSrc:   PainSc: 0-No pain                 Precious Haws Beverlyann Broxterman

## 2016-03-24 NOTE — OR Nursing (Signed)
Pt states he is allergy to something that he took while in hospital in 2009 and had anaphylactic reaction -but does not know what med

## 2016-03-24 NOTE — Anesthesia Procedure Notes (Signed)
Procedure Name: LMA Insertion Date/Time: 03/24/2016 3:39 PM Performed by: Dionne Bucy Pre-anesthesia Checklist: Patient identified, Patient being monitored, Timeout performed, Emergency Drugs available and Suction available Patient Re-evaluated:Patient Re-evaluated prior to inductionOxygen Delivery Method: Circle system utilized Preoxygenation: Pre-oxygenation with 100% oxygen Intubation Type: IV induction Ventilation: Mask ventilation without difficulty LMA: LMA inserted LMA Size: 5.0 Tube type: Oral Number of attempts: 1 Placement Confirmation: positive ETCO2 and breath sounds checked- equal and bilateral Tube secured with: Tape Dental Injury: Teeth and Oropharynx as per pre-operative assessment

## 2016-03-24 NOTE — Discharge Instructions (Addendum)
Keep dressing dry and intact.  May shower after dressing changed on post-op day #4 (Monday).  Cover sutures with Band-Aids after drying off. Apply ice frequently to knee. Take ibuprofen 800 mg TID with meals for 7-10 days, then as necessary. Take pain medication as prescribed or ES Tylenol when needed.  May weight-bear as tolerated - use crutches or walker as needed. Follow-up in 10-14 days or as scheduled.   AMBULATORY SURGERY  DISCHARGE INSTRUCTIONS   1) The drugs that you were given will stay in your system until tomorrow so for the next 24 hours you should not:  A) Drive an automobile B) Make any legal decisions C) Drink any alcoholic beverage   2) You may resume regular meals tomorrow.  Today it is better to start with liquids and gradually work up to solid foods.  You may eat anything you prefer, but it is better to start with liquids, then soup and crackers, and gradually work up to solid foods.   3) Please notify your doctor immediately if you have any unusual bleeding, trouble breathing, redness and pain at the surgery site, drainage, fever, or pain not relieved by medication.    4) Additional Instructions:        Please contact your physician with any problems or Same Day Surgery at [phone], Monday through Friday 6 am to 4 pm, or [place] [facility] number at [phone].

## 2016-03-24 NOTE — Op Note (Signed)
03/24/2016  4:56 PM  Patient:   Michael Wu  Pre-Op Diagnosis:   Complex tear of discoid lateral meniscus, right knee.  Postoperative diagnosis:   Same.  Procedure:   Arthroscopic debridement with contouring of discoid lateral meniscus tear, right knee.  Surgeon:   Pascal Lux, M.D.  Asst.:   Donnie Coffin, PA-S  Anesthesia:   General LMA.  Findings:   As above. The medial meniscus was intact to probing, as were the anterior and posterior cruciate ligaments. The articular surfaces of the patella, the femur, and the tibia all were in satisfactory condition.  Complications:   None.  EBL:   10 cc.  Total fluids:   1000 cc of crystalloid.  Tourniquet time:   None  Drains:   None  Closure:   4-0 Prolene interrupted sutures.  Brief clinical note:   The patient is a 46 year old male with a several month history of right knee pain. His symptoms have persisted despite medications, activity modification, etc. His history and examination were suspicious for a meniscal tear. An MRI scan demonstrated the presence of a large discoid lateral meniscus with complex tearing. The patient presents at this time for arthroscopy, debridement, and partial lateral meniscectomy.  Procedure:   The patient was brought into the operating room and lain in the supine position. After adequate general laryngal mask anesthesia was obtained, a timeout was performed to verify the appropriate side. The patient's right knee was injected sterilely using a solution of 30 cc of 1% lidocaine and 30 cc of 0.5% Sensorcaine with epinephrine. The right lower extremity was prepped with ChloraPrep solution before being draped sterilely. Preoperative antibiotics were administered. The expected portal sites were injected with 0.5% Sensorcaine with epinephrine before the camera was placed in the anterolateral portal and instrumentation performed through the anteromedial portal. The knee was sequentially examined beginning  in the suprapatellar pouch, then progressing to the patellofemoral space, the medial gutter compartment, the notch, and finally the lateral compartment and gutter. The findings were as described above. Abundant reactive synovial tissues anteriorly were debrided using the full-radius resector in order to improve visualization. The medial meniscus was stable to probing. Laterally, there was a discoid lateral meniscus with some tearing. The discoid lateral meniscus was contoured back to a C-shaped configuration using a combination of straight and angled baskets, the full-radius resector, and the ArthroCare wand. A conscious effort was made to taper the central-most portions of the meniscus. Subsequent probing of the meniscal rim demonstrated excellent stability. The instruments were removed from the joint after suctioning the excess fluid. The portal sites were closed using 4-0 Prolene interrupted sutures before a sterile bulky dressing was applied to the knee. The patient was then awakened, extubated, and returned to the recovery room in satisfactory condition after tolerating the procedure well.

## 2016-03-24 NOTE — Transfer of Care (Signed)
Immediate Anesthesia Transfer of Care Note  Patient: Michael Wu  Procedure(s) Performed: Procedure(s): KNEE ARTHROSCOPY WITH PARTIAL LATERAL MENISECTOMY (Right)  Patient Location: PACU  Anesthesia Type:General  Level of Consciousness: sedated  Airway & Oxygen Therapy: Patient Spontanous Breathing and Patient connected to face mask oxygen  Post-op Assessment: Report given to RN and Post -op Vital signs reviewed and stable  Post vital signs: Reviewed and stable  Last Vitals:  Vitals:   03/24/16 1333 03/24/16 1702  BP: 118/69 (!) 120/95  Pulse: 72 71  Resp: 16 12  Temp: (!) 35.6 C 36.5 C    Last Pain:  Vitals:   03/24/16 1702  TempSrc:   PainSc: (P) Asleep         Complications: No apparent anesthesia complications

## 2016-03-25 ENCOUNTER — Encounter: Payer: Self-pay | Admitting: Surgery

## 2016-06-07 ENCOUNTER — Other Ambulatory Visit: Payer: Self-pay | Admitting: Family Medicine

## 2016-06-07 DIAGNOSIS — R0982 Postnasal drip: Principal | ICD-10-CM

## 2016-06-07 DIAGNOSIS — J309 Allergic rhinitis, unspecified: Secondary | ICD-10-CM

## 2016-07-03 ENCOUNTER — Other Ambulatory Visit: Payer: Self-pay | Admitting: Family Medicine

## 2016-07-03 DIAGNOSIS — R0982 Postnasal drip: Principal | ICD-10-CM

## 2016-07-03 DIAGNOSIS — J309 Allergic rhinitis, unspecified: Secondary | ICD-10-CM

## 2016-12-12 ENCOUNTER — Ambulatory Visit (INDEPENDENT_AMBULATORY_CARE_PROVIDER_SITE_OTHER): Payer: Managed Care, Other (non HMO) | Admitting: Family Medicine

## 2016-12-12 ENCOUNTER — Encounter: Payer: Self-pay | Admitting: Family Medicine

## 2016-12-12 VITALS — BP 124/86 | HR 80 | Temp 98.2°F | Ht 69.0 in | Wt 225.0 lb

## 2016-12-12 DIAGNOSIS — J0100 Acute maxillary sinusitis, unspecified: Secondary | ICD-10-CM | POA: Diagnosis not present

## 2016-12-12 DIAGNOSIS — J40 Bronchitis, not specified as acute or chronic: Secondary | ICD-10-CM

## 2016-12-12 MED ORDER — GUAIFENESIN-CODEINE 100-10 MG/5ML PO SYRP: 5.0000 mL | ORAL_SOLUTION | Freq: Three times a day (TID) | ORAL | 0 refills | Status: DC | PRN

## 2016-12-12 MED ORDER — AZITHROMYCIN 250 MG PO TABS: ORAL_TABLET | ORAL | 0 refills | Status: DC

## 2016-12-12 NOTE — Progress Notes (Signed)
Name: Michael Wu   MRN: [id number]    DOB: 1970-08-14   Date:12/12/2016       Progress Note  Subjective  Chief Complaint  Chief Complaint  Patient presents with  . Sinusitis    sore throat, cong- yellow production    Sinusitis  This is a new problem. The current episode started in the past 7 days. The problem has been waxing and waning since onset. There has been no fever (low grade /Saturday). Associated symptoms include chills, congestion and coughing. Pertinent negatives include no diaphoresis, ear pain, headaches, hoarse voice, neck pain, shortness of breath, sinus pressure, sneezing, sore throat or swollen glands. Past treatments include nothing. The treatment provided mild relief.  Cough  This is a new problem. The current episode started in the past 7 days. The problem has been waxing and waning. The cough is productive of purulent sputum. Associated symptoms include chills and nasal congestion. Pertinent negatives include no chest pain, ear pain, fever, headaches, heartburn, myalgias, rash, rhinorrhea, sore throat, shortness of breath, weight loss or wheezing. There is no history of environmental allergies.    No problem-specific Assessment & Plan notes found for this encounter.   Past Medical History:  Diagnosis Date  . Complication of anesthesia    PT HAD AN ANAPHYLAXIS REACTION IN 2008 AFTER SURGERY AT HOME THE NEXT DAY-PT UNSURE OF THE CAUSE BUT WAS TREATED WITH STEROIDS AND BENADRYL AND DC'D HOME THE SAME DAY  . GERD (gastroesophageal reflux disease)    RARE-TUMS PRN  . Headache    H/O MIGRAINES    Past Surgical History:  Procedure Laterality Date  . ANKLE ARTHODESIS W/ ARTHROSCOPY Left   . KNEE ARTHROCENTESIS Right   . KNEE ARTHROSCOPY WITH MENISCAL REPAIR Right 03/24/2016   Procedure: KNEE ARTHROSCOPY WITH PARTIAL LATERAL MENISECTOMY;  Surgeon: Corky Mull, MD;  Location: [facility] ORS;  Service: Orthopedics;  Laterality: Right;    Family History  Problem  Relation Age of Onset  . Cancer Father   . Heart disease Father     Social History   Social History  . Marital status: Married    Spouse name: N/A  . Number of children: N/A  . Years of education: N/A   Occupational History  . Not on file.   Social History Main Topics  . Smoking status: Never Smoker  . Smokeless tobacco: Current User    Types: Chew  . Alcohol use 0.6 oz/week    1 Cans of beer per week     Comment: OCC  . Drug use: No  . Sexual activity: Yes   Other Topics Concern  . Not on file   Social History Narrative  . No narrative on file    No Known Allergies  Outpatient Medications Prior to Visit  Medication Sig Dispense Refill  . acetaminophen (TYLENOL) 325 MG tablet Take 650 mg by mouth every 6 (six) hours as needed for mild pain.    . calcium carbonate (TUMS - DOSED IN MG ELEMENTAL CALCIUM) 500 MG chewable tablet Chew 1 tablet by mouth as needed for indigestion or heartburn.    Marland Kitchen EPINEPHrine (EPIPEN 2-PAK IJ) Inject 1 Dose as directed as needed.    . montelukast (SINGULAIR) 10 MG tablet TAKE 1 TABLET (10 MG TOTAL) BY MOUTH AT BEDTIME. (Patient not taking: Reported on 12/12/2016) 30 tablet 0  . HYDROcodone-acetaminophen (NORCO) 5-325 MG tablet Take 1-2 tablets by mouth every 6 (six) hours as needed for moderate pain. MAXIMUM TOTAL ACETAMINOPHEN DOSE  IS 4000 MG PER DAY 30 tablet 0   No facility-administered medications prior to visit.     Review of Systems  Constitutional: Positive for chills. Negative for diaphoresis, fever, malaise/fatigue and weight loss.  HENT: Positive for congestion. Negative for ear discharge, ear pain, hoarse voice, rhinorrhea, sinus pressure, sneezing and sore throat.   Eyes: Negative for blurred vision.  Respiratory: Positive for cough. Negative for sputum production, shortness of breath and wheezing.   Cardiovascular: Negative for chest pain, palpitations and leg swelling.  Gastrointestinal: Negative for abdominal pain, blood in  stool, constipation, diarrhea, heartburn, melena and nausea.  Genitourinary: Negative for dysuria, frequency, hematuria and urgency.  Musculoskeletal: Negative for back pain, joint pain, myalgias and neck pain.  Skin: Negative for rash.  Neurological: Negative for dizziness, tingling, sensory change, focal weakness and headaches.  Endo/Heme/Allergies: Negative for environmental allergies and polydipsia. Does not bruise/bleed easily.  Psychiatric/Behavioral: Negative for depression and suicidal ideas. The patient is not nervous/anxious and does not have insomnia.      Objective  Vitals:   12/12/16 1122  BP: 124/86  Pulse: 80  Temp: 98.2 F (36.8 C)  TempSrc: Oral  SpO2: 99%  Weight: 225 lb (102.1 kg)  Height: 5\' 9"  (1.753 m)    Physical Exam  Constitutional: He is oriented to person, place, and time and well-developed, well-nourished, and in no distress.  HENT:  Head: Normocephalic.  Right Ear: External ear normal.  Left Ear: External ear normal.  Nose: Nose normal.  Mouth/Throat: Oropharynx is clear and moist.  Eyes: Conjunctivae and EOM are normal. Pupils are equal, round, and reactive to light. Right eye exhibits no discharge. Left eye exhibits no discharge. No scleral icterus.  Neck: Normal range of motion. Neck supple. No JVD present. No tracheal deviation present. No thyromegaly present.  Cardiovascular: Normal rate, regular rhythm, normal heart sounds and intact distal pulses.  Exam reveals no gallop and no friction rub.   No murmur heard. Pulmonary/Chest: Breath sounds normal. No respiratory distress. He has no wheezes. He has no rales.  Abdominal: Soft. Bowel sounds are normal. He exhibits no mass. There is no hepatosplenomegaly. There is no tenderness. There is no rebound, no guarding and no CVA tenderness.  Musculoskeletal: Normal range of motion. He exhibits no edema or tenderness.  Lymphadenopathy:    He has no cervical adenopathy.  Neurological: He is alert and  oriented to person, place, and time. He has normal sensation, normal strength, normal reflexes and intact cranial nerves. No cranial nerve deficit.  Skin: Skin is warm. No rash noted.  Psychiatric: Mood and affect normal.  Nursing note and vitals reviewed.     Assessment & Plan  Problem List Items Addressed This Visit    None    Visit Diagnoses    Acute maxillary sinusitis, recurrence not specified    -  Primary   Relevant Medications   azithromycin (ZITHROMAX) 250 MG tablet   guaiFENesin-codeine (ROBITUSSIN AC) 100-10 MG/5ML syrup   Bronchitis       Relevant Medications   azithromycin (ZITHROMAX) 250 MG tablet      Meds ordered this encounter  Medications  . azithromycin (ZITHROMAX) 250 MG tablet    Sig: 2 today then 1 a day for four days    Dispense:  6 tablet    Refill:  0  . guaiFENesin-codeine (ROBITUSSIN AC) 100-10 MG/5ML syrup    Sig: Take 5 mLs by mouth 3 (three) times daily as needed for cough.    Dispense:  100 mL    Refill:  0      Dr. [facility]  12/12/16

## 2018-07-07 IMAGING — MR MR KNEE*R* W/O CM
6 series · 40 of 40 positions shown · non-contrast
Comparison: None.

CLINICAL DATA: Right knee pain.  Previous repair of torn meniscus.

EXAM:
MRI OF THE RIGHT KNEE WITHOUT CONTRAST
TECHNIQUE: Multiplanar, multisequence MR imaging of the knee was performed. No
intravenous contrast was administered.

[Series 3: PD fat-sat · axial · 3.0mm · 0.31mm/px · z∈[-50,+59]mm · 6 of 34 slices shown (1 of 4)]
[im 1/34]
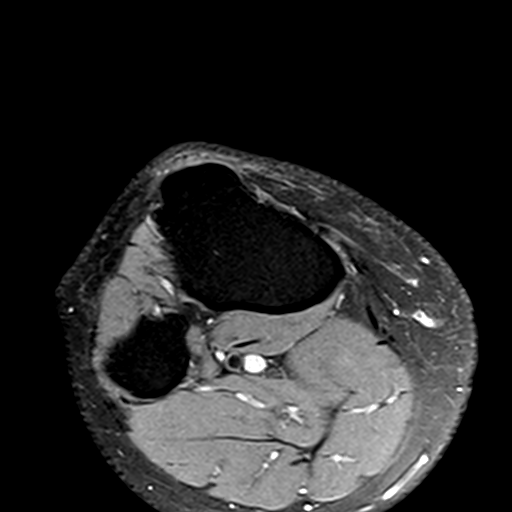
[im 7/34]
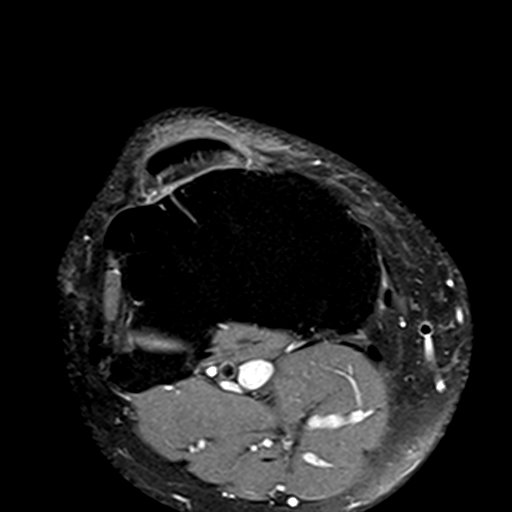
[im 14/34]
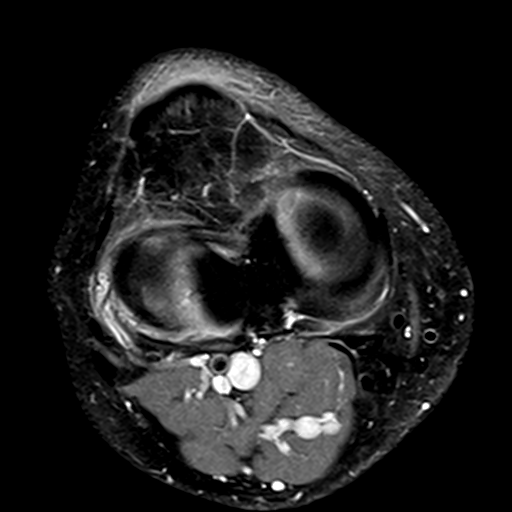
[im 20/34]
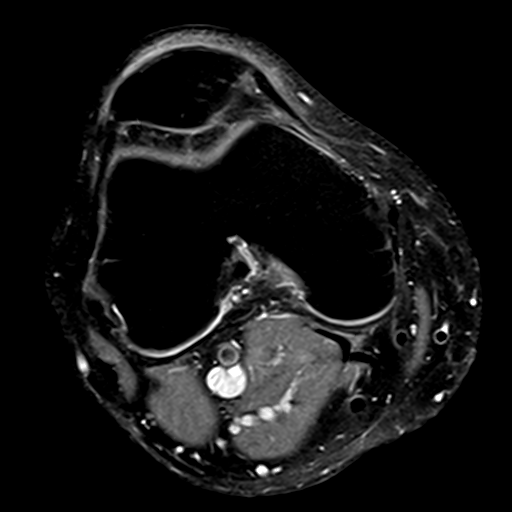
[im 27/34]
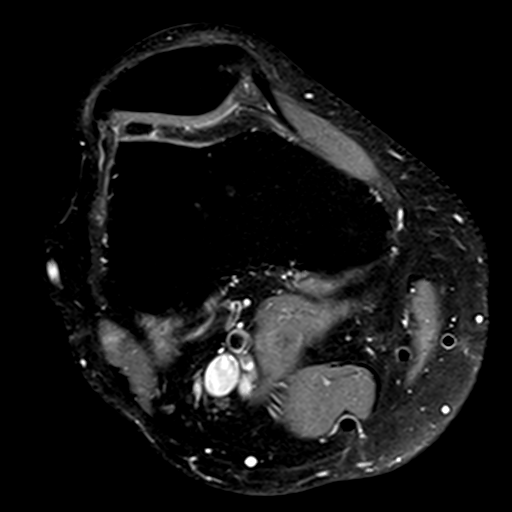
[im 34/34]
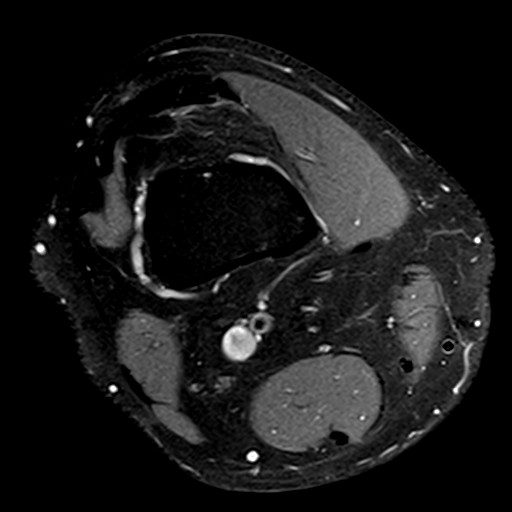

[Series 4: T1 · coronal · 3.0mm · 0.50mm/px · 8 of 36 slices shown]
[im 1/36]
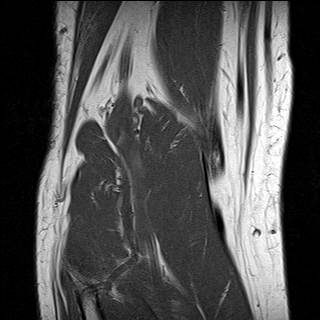
[im 6/36]
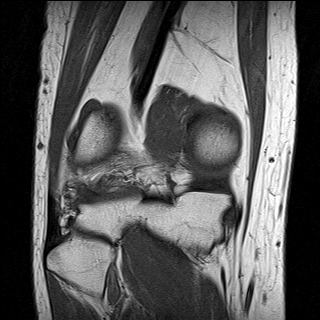
[im 11/36]
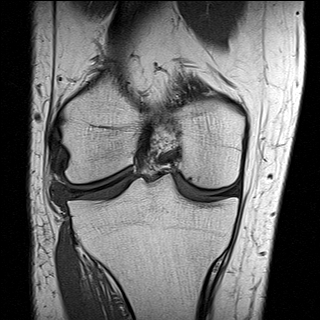
[im 16/36]
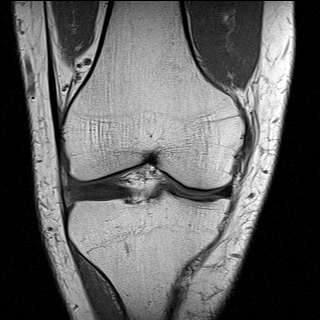
[im 21/36]
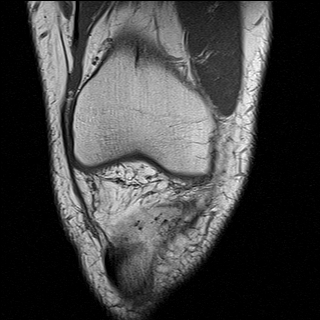
[im 26/36]
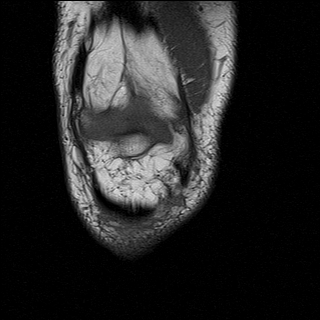
[im 31/36]
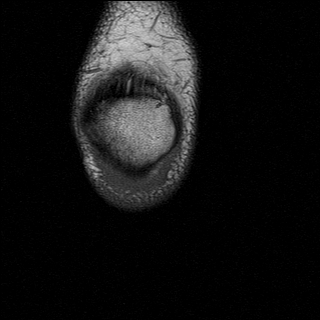
[im 36/36]
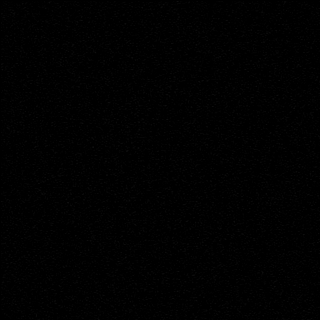

[Series 5: PD fat-sat · sagittal · 3.0mm · 0.62mm/px · 7 of 34 slices shown (2 of 4)]
[im 1/34]
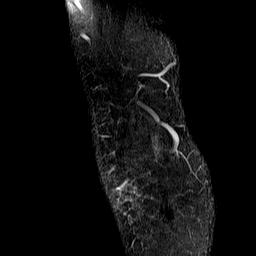
[im 6/34]
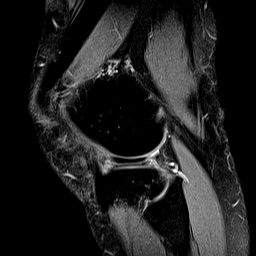
[im 12/34]
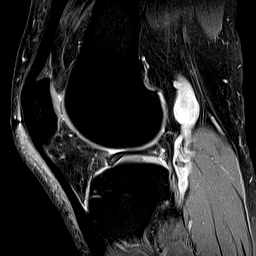
[im 17/34]
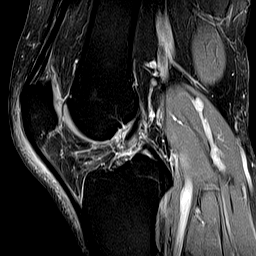
[im 23/34]
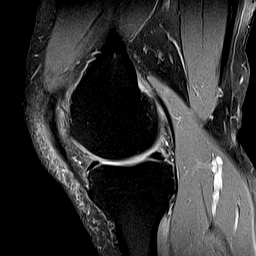
[im 28/34]
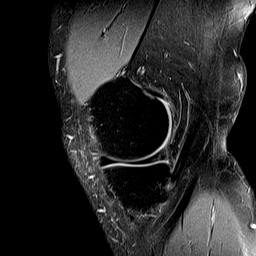
[im 34/34]
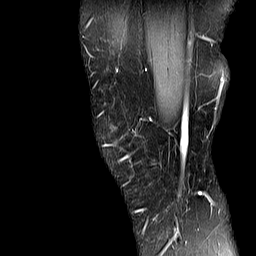

[Series 6: T2 fat-sat · coronal · 3.0mm · 0.50mm/px · 8 of 36 slices shown]
[im 1/36]
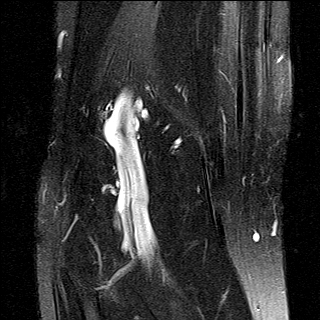
[im 6/36]
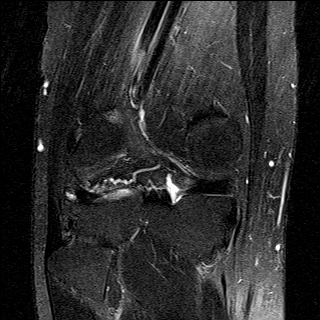
[im 11/36]
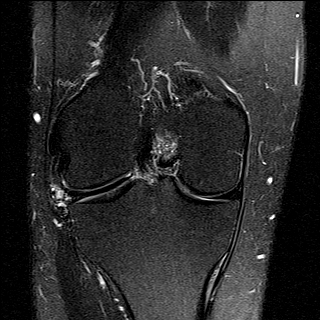
[im 16/36]
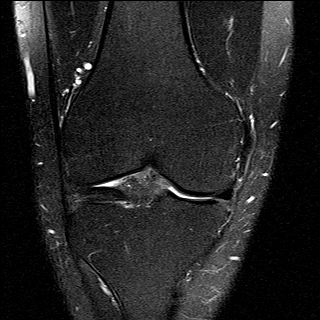
[im 21/36]
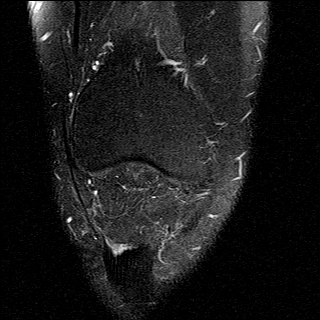
[im 26/36]
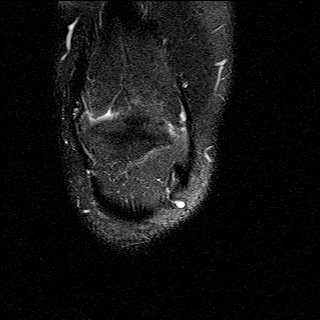
[im 31/36]
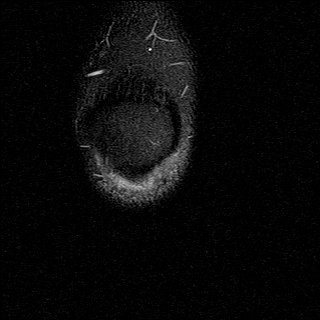
[im 36/36]
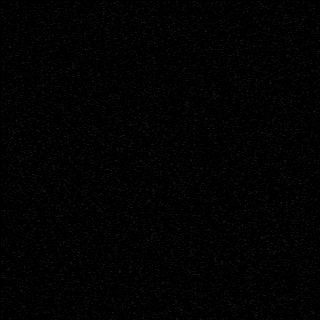

[Series 7: PD fat-sat · coronal · 3.0mm · 0.62mm/px · 8 of 36 slices shown (3 of 4)]
[im 1/36]
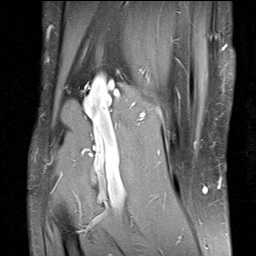
[im 6/36]
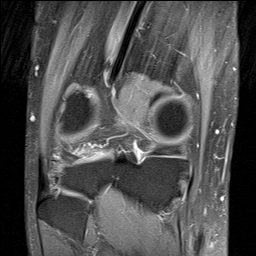
[im 11/36]
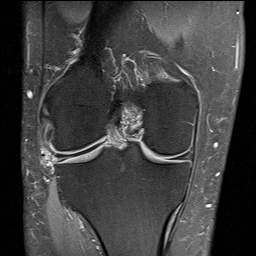
[im 16/36]
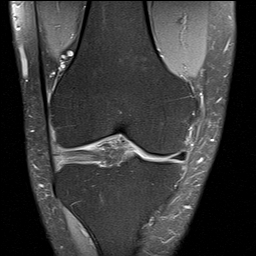
[im 21/36]
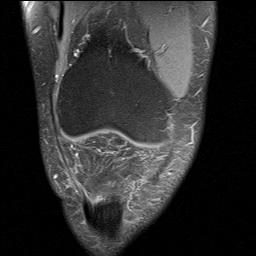
[im 26/36]
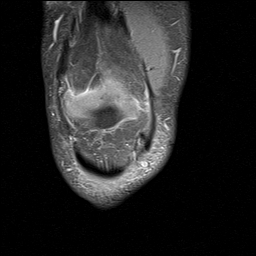
[im 31/36]
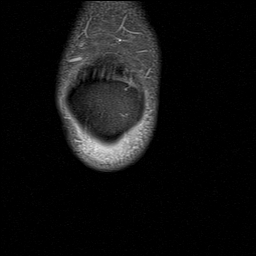
[im 36/36]
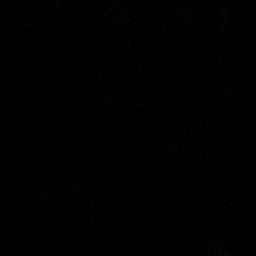

[Series 8: PD fat-sat · coronal · 2.0mm · 0.62mm/px · 3 of 13 slices shown (4 of 4)]
[im 1/13]
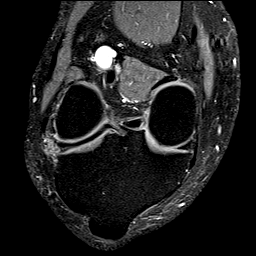
[im 7/13]
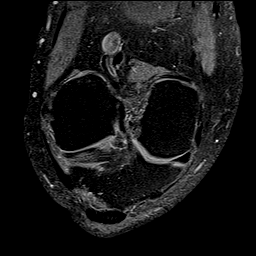
[im 13/13]
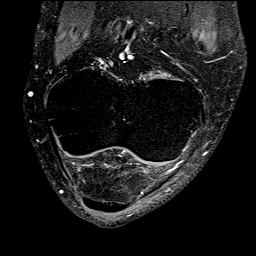

[40 of 40 positions shown; findings below may reference images not displayed]

FINDINGS: MENISCI

Medial meniscus: There is abnormal signal from the periphery of the
posterior horn and midbody which may be the area of previous
meniscal repair. This signal does extend to the undersurface of the
periphery on images 8 and 9 of series 7.

Lateral meniscus: Discoid meniscus. Horizontal tear extends from
anterior to posterior and extends from the undersurface at posterior
lateral corner to the periphery further multiple small parameniscal
cysts.

LIGAMENTS

Cruciates: Degenerative changes of the distal ACL. PCL is intact but
also somewhat diminutive.

Collaterals:  Normal.

CARTILAGE

Patellofemoral:  Minimal chondromalacia of the patella.

Medial:  Normal.

Lateral: Small focal fissure in the central portion of the tibial
plateau. Focal grade 4 chondromalacia of the far posterior aspect of
lateral femoral condyle.

Joint:  No effusion.

Popliteal Fossa:  No Baker's cyst or other abnormality.

Extensor Mechanism:  Normal.

Bones:  Normal.

Other: None
IMPRESSION: 1. Extensive horizontal tear involving the discoid lateral meniscus
with extension to the periphery with multiple parameniscal cysts
along the midbody.
2. Diminutive but intact ACL and PCL.
3. Focal signal abnormality in the posterior horn of medial meniscus
which I suspect represents an area of previous meniscal tear and
repair.
4. Focal chondromalacia of the lateral  compartment.

## 2019-01-29 ENCOUNTER — Encounter: Payer: Self-pay | Admitting: Family Medicine

## 2019-01-29 ENCOUNTER — Ambulatory Visit: Payer: Managed Care, Other (non HMO) | Admitting: Family Medicine

## 2019-01-29 ENCOUNTER — Other Ambulatory Visit: Payer: Self-pay

## 2019-01-29 VITALS — BP 130/80 | HR 76 | Ht 69.0 in | Wt 222.0 lb

## 2019-01-29 DIAGNOSIS — M7712 Lateral epicondylitis, left elbow: Secondary | ICD-10-CM

## 2019-01-29 DIAGNOSIS — S42435A Nondisplaced fracture (avulsion) of lateral epicondyle of left humerus, initial encounter for closed fracture: Secondary | ICD-10-CM | POA: Diagnosis not present

## 2019-01-29 MED ORDER — MELOXICAM 15 MG PO TABS: 15.0000 mg | ORAL_TABLET | Freq: Every day | ORAL | 0 refills | Status: DC

## 2019-01-29 NOTE — Progress Notes (Signed)
Date:  01/29/2019   Name:  Michael Wu   DOB:  02/12/1971   MRN:  [id number]   Chief Complaint: Elbow Pain (L) elbow pain after moving sofa by himself in Feb.- )  Arm Pain  The incident occurred more than 1 week ago (2 /15/20). The incident occurred at home. The injury mechanism was repetitive motion (moving a couch). The pain is present in the left elbow. The quality of the pain is described as aching. The pain is moderate. The pain has been fluctuating since the incident. Associated symptoms include numbness and tingling. Pertinent negatives include no chest pain or muscle weakness. The symptoms are aggravated by movement and palpation (over lateral epicondyle). He has tried NSAIDs for the symptoms. The treatment provided mild relief.    Review of Systems  Constitutional: Negative for chills and fever.  HENT: Negative for drooling, ear discharge, ear pain and sore throat.   Respiratory: Negative for cough, shortness of breath and wheezing.   Cardiovascular: Negative for chest pain, palpitations and leg swelling.  Gastrointestinal: Negative for abdominal pain, blood in stool, constipation, diarrhea and nausea.  Endocrine: Negative for polydipsia.  Genitourinary: Negative for dysuria, frequency, hematuria and urgency.  Musculoskeletal: Negative for back pain, myalgias and neck pain.  Skin: Negative for rash.  Allergic/Immunologic: Negative for environmental allergies.  Neurological: Positive for tingling and numbness. Negative for dizziness and headaches.  Hematological: Does not bruise/bleed easily.  Psychiatric/Behavioral: Negative for suicidal ideas. The patient is not nervous/anxious.     Patient Active Problem List   Diagnosis Date Noted  . 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 ANKLE ARTHODESIS W/ ARTHROSCOPY Left   . KNEE ARTHROCENTESIS Right   . KNEE ARTHROSCOPY WITH MENISCAL REPAIR Right  03/24/2016   Procedure: KNEE ARTHROSCOPY WITH PARTIAL LATERAL MENISECTOMY;  Surgeon: Corky Mull, MD;  Location: [facility] ORS;  Service: Orthopedics;  Laterality: Right;    Social History   Tobacco Use  . Smoking status: Never Smoker  . Smokeless tobacco: Current User    Types: Chew  Substance Use Topics  . Alcohol use: Yes    Alcohol/week: 1.0 standard drinks    Types: 1 Cans of beer per week    Comment: OCC  . Drug use: No     Medication list has been reviewed and updated.  Current Meds  Medication Sig  . acetaminophen (TYLENOL) 325 MG tablet Take 650 mg by mouth every 6 (six) hours as needed for mild pain.  . calcium carbonate (TUMS - DOSED IN MG ELEMENTAL CALCIUM) 500 MG chewable tablet Chew 1 tablet by mouth as needed for indigestion or heartburn.  Marland Kitchen EPINEPHrine (EPIPEN 2-PAK IJ) Inject 1 Dose as directed as needed.    PHQ 2/9 Scores 01/29/2019 11/19/2015  PHQ - 2 Score 0 0  PHQ- 9 Score 0 -    BP Readings from Last 3 Encounters:  01/29/19 130/80  12/12/16 124/86  03/24/16 128/80    Physical Exam Vitals signs and nursing note reviewed.  HENT:     Head: Normocephalic.     Right Ear: Tympanic membrane, ear canal and external ear normal.     Left Ear: Tympanic membrane, ear canal and external ear normal.     Nose: Nose normal.  Eyes:     General: No scleral icterus.       Right eye: No discharge.        Left  eye: No discharge.     Conjunctiva/sclera: Conjunctivae normal.     Pupils: Pupils are equal, round, and reactive to light.  Neck:     Musculoskeletal: Normal range of motion and neck supple.     Thyroid: No thyromegaly.     Vascular: No JVD.     Trachea: No tracheal deviation.  Cardiovascular:     Rate and Rhythm: Normal rate and regular rhythm.     Heart sounds: Normal heart sounds. No murmur. No friction rub. No gallop.   Pulmonary:     Effort: No respiratory distress.     Breath sounds: Normal breath sounds. No wheezing or rales.  Abdominal:      General: Bowel sounds are normal.     Palpations: Abdomen is soft. There is no mass.     Tenderness: There is no abdominal tenderness. There is no guarding or rebound.  Musculoskeletal: Normal range of motion.     Left elbow: He exhibits swelling. Tenderness found. Lateral epicondyle tenderness noted.  Lymphadenopathy:     Cervical: No cervical adenopathy.  Skin:    General: Skin is warm.     Findings: No rash.  Neurological:     Mental Status: He is alert and oriented to person, place, and time.     Cranial Nerves: No cranial nerve deficit.     Deep Tendon Reflexes: Reflexes are normal and symmetric.     Wt Readings from Last 3 Encounters:  01/29/19 222 lb (100.7 kg)  12/12/16 225 lb (102.1 kg)  03/24/16 217 lb (98.4 kg)    BP 130/80   Pulse 76   Ht 5\' 9"  (1.753 m)   Wt 222 lb (100.7 kg)   BMI 32.78 kg/m   Assessment and Plan: 1. Closed nondisplaced avulsion fracture of lateral epicondyle of left humerus, initial encounter 2 episodes first episode was in February of this year which he was pulling on his sofa and felt a pop in his left elbow with a sudden pain is remained sore and swollen for a period of time and eventually decreased but continued to have baseline discomfort.  Second on July 4 weekend he went to the beach and was unloading and unloading and and had a sudden swelling and discomfort in the left elbow lateral aspect consistent with hematoma is eventually went away but has continued to have pain in particular hurts using tin shears and repetitive motion of the wrist.  This is consistent with a probable evulsion fracture of the dorsiflexors of the left arm.  Will initiate meloxicam 15 mg once a day and place referral for orthopedics to evaluate. - Ambulatory referral to Orthopedic Surgery  2. Lateral epicondylitis of left elbow Patient probably had an evulsion fracture but has baseline residual lateral epicondylitis which is repetitively with the inflamed by the  patient's daily work activities.  Will initiate meloxicam 15 mg once a day. - meloxicam (MOBIC) 15 MG tablet; Take 1 tablet (15 mg total) by mouth daily.  Dispense: 30 tablet; Refill: 0

## 2019-01-29 NOTE — Patient Instructions (Signed)
Tennis Elbow    Tennis elbow (lateral epicondylitis) is inflammation of tendons in your outer forearm, near your elbow. Tendons are tissues that connect muscle to bone. When you have tennis elbow, inflammation affects the tendons that you use to bend your wrist and move your hand up. Inflammation occurs in the lower part of the upper arm bone (humerus), where the tendons connect to the bone (lateral epicondyle).  Tennis elbow often affects people who play tennis, but anyone may get the condition from repeatedly extending the wrist or turning the forearm.  What are the causes?  This condition is usually caused by repeatedly extending the wrist, turning the forearm, and using the hands. It can result from sports or work that requires repetitive forearm movements. In some cases, it may be caused by a sudden injury.  What increases the risk?  You are more likely to develop tennis elbow if you play tennis or another racket sport. You also have a higher risk if you frequently use your hands for work. Besides people who play tennis, others at greater risk include:  Musicians.  Carpenters, painters, and plumbers.  Cooks.  Cashiers.  People who work in factories.  Construction workers.  Butchers.  People who use computers.  What are the signs or symptoms?  Symptoms of this condition include:  Pain and tenderness in the forearm and the outer part of the elbow. Pain may be felt only when using the arm, or it may be there all the time.  A burning feeling that starts in the elbow and spreads down the forearm.  A weak grip in the hand.  How is this diagnosed?  This condition may be diagnosed based on:  Your symptoms and medical history.  A physical exam.  X-rays.  MRI.  How is this treated?  Resting and icing your arm is often the first treatment. Your health care provider may also recommend:  Medicines to reduce pain and inflammation. These may be in the form of a pill, topical gels, or shots of a steroid medicine  (cortisone).  An elbow strap to reduce stress on the area.  Physical therapy. This may include massage or exercises.  An elbow brace to restrict the movements that cause symptoms.  If these treatments do not help relieve your symptoms, your health care provider may recommend surgery to remove damaged muscle and reattach healthy muscle to bone.  Follow these instructions at home:  Activity  Rest your elbow and wrist and avoid activities that cause symptoms, as told by your health care provider.  Do physical therapy exercises as instructed.  If you lift an object, lift it with your palm facing up. This reduces stress on your elbow.  Lifestyle  If your tennis elbow is caused by sports, check your equipment and make sure that:  You are using it correctly.  It is the best fit for you.  If your tennis elbow is caused by work or computer use, take frequent breaks to stretch your arm. Talk with your manager about ways to manage your condition at work.  If you have a brace:  Wear the brace or strap as told by your health care provider. Remove it only as told by your health care provider.  Loosen the brace if your fingers tingle, become numb, or turn cold and blue.  Keep the brace clean.  If the brace is not waterproof, ask if you may remove it for bathing. If you must keep the brace on while bathing:    bath or a shower. General instructions   If directed, put ice on the painful area: ? Put ice in a plastic bag. ? Place a towel between your skin and the bag. ? Leave the ice on for 20 minutes, 2-3 times a day.  Take over-the-counter and prescription medicines only as told by your health care provider.  Keep all follow-up visits as told by your health care provider. This is important. Contact a health care provider if:  You have pain that gets worse or does not get better with treatment.   You have numbness or weakness in your forearm, hand, or fingers. Summary  Tennis elbow (lateral epicondylitis) is inflammation of tendons in your outer forearm, near your elbow.  Common symptoms include pain and tenderness in your forearm and the outer part of your elbow.  This condition is usually caused by repeatedly extending your wrist, turning your forearm, and using your hands.  The first treatment is often resting and icing your arm to relieve symptoms. Further treatment may include taking medicine, getting physical therapy, wearing a brace or strap, or having surgery. This information is not intended to replace advice given to you by your health care provider. Make sure you discuss any questions you have with your health care provider. Document Released: 06/27/2005 Document Revised: 03/23/2018 Document Reviewed: 04/11/2017 Elsevier Patient Education  2020 [employer].

## 2021-11-22 ENCOUNTER — Encounter: Payer: Self-pay | Admitting: Emergency Medicine

## 2021-11-22 ENCOUNTER — Ambulatory Visit
Admission: EM | Admit: 2021-11-22 | Discharge: 2021-11-22 | Disposition: A | Discharge status: Home / Self Care | Payer: Managed Care, Other (non HMO) | Attending: Emergency Medicine | Admitting: Emergency Medicine

## 2021-11-22 DIAGNOSIS — J069 Acute upper respiratory infection, unspecified: Secondary | ICD-10-CM

## 2021-11-22 MED ORDER — PROMETHAZINE-DM 6.25-15 MG/5ML PO SYRP: 5.0000 mL | ORAL_SOLUTION | Freq: Four times a day (QID) | ORAL | 0 refills | Status: DC | PRN

## 2021-11-22 MED ORDER — AZITHROMYCIN 250 MG PO TABS: 250.0000 mg | ORAL_TABLET | Freq: Every day | ORAL | 0 refills | Status: DC

## 2021-11-22 NOTE — ED Provider Notes (Signed)
?[place] ? ? ? ?CSN: [id number] ?Arrival date & time: 11/22/21  2355 ? ? ?  ? ?History   ?Chief Complaint ?Chief Complaint  ?Patient presents with  ? Cough  ? Nasal Congestion  ? Chills  ? Sore Throat  ? ? ?HPI ?Michael Wu is a 51 y.o. male.  ? ?Patient presents with fever, chills, body aches, nasal congestion, rhinorrhea, sinus pain and pressure, sore throat and a nonproductive cough for 6 days.  Decreased appetite but tolerating some food and fluids.  No known sick contacts.  Home COVID test negative.  Has attempted use of Mucinex, Sudafed, Delsym and Robitussin, Tylenol and ibuprofen which has given no relief.  History of GERD, migraines. ? ? ? ?Past Medical History:  ?Diagnosis Date  ? Complication of anesthesia   ? PT HAD AN ANAPHYLAXIS REACTION IN 2008 AFTER SURGERY AT HOME THE NEXT DAY-PT UNSURE OF THE CAUSE BUT WAS TREATED WITH STEROIDS AND BENADRYL AND DC'D HOME THE SAME DAY  ? GERD (gastroesophageal reflux disease)   ? RARE-TUMS PRN  ? Headache   ? H/O MIGRAINES  ? ? ?Patient Active Problem List  ? Diagnosis Date Noted  ? 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 ? ?Past Surgical History:  ?Procedure Laterality Date  ? ANKLE ARTHODESIS W/ ARTHROSCOPY Left   ? KNEE ARTHROCENTESIS Right   ? KNEE ARTHROSCOPY WITH MENISCAL REPAIR Right 03/24/2016  ? Procedure: KNEE ARTHROSCOPY WITH PARTIAL LATERAL MENISECTOMY;  Surgeon: Corky Mull, MD;  Location: [facility] ORS;  Service: Orthopedics;  Laterality: Right;  ? ? ? ? ? ?Home Medications   ? ?Prior to Admission medications   ?Medication Sig Start Date End Date Taking? Authorizing Provider  ?acetaminophen (TYLENOL) 325 MG tablet Take 650 mg by mouth every 6 (six) hours as needed for mild pain.    [provider]  ?calcium carbonate (TUMS - DOSED IN MG ELEMENTAL CALCIUM) 500 MG chewable tablet Chew 1 tablet by mouth as needed for indigestion or heartburn.    [provider]  ?EPINEPHrine (EPIPEN 2-PAK IJ) Inject 1 Dose as directed as  needed.    [provider]  ?meloxicam (MOBIC) 15 MG tablet Take 1 tablet (15 mg total) by mouth daily. 01/29/19   Juline Patch, MD  ? ? ?Family History ?Family History  ?Problem Relation Age of Onset  ? Cancer Father   ? Heart disease Father   ? ? ?Social History ?Social History  ? ?Tobacco Use  ? Smoking status: Never  ? Smokeless tobacco: Current  ?  Types: Chew  ?Substance Use Topics  ? Alcohol use: Yes  ?  Alcohol/week: 1.0 standard drink  ?  Types: 1 Cans of beer per week  ?  Comment: OCC  ? Drug use: No  ? ? ? ?Allergies   ?Patient has no known allergies. ? ? ?Review of Systems ?Review of Systems  ?Constitutional:  Positive for appetite change, chills and fever. Negative for activity change, diaphoresis, fatigue and unexpected weight change.  ?HENT:  Positive for congestion, rhinorrhea, sinus pressure, sinus pain and sore throat. Negative for dental problem, drooling, ear discharge, ear pain, facial swelling, hearing loss, mouth sores, nosebleeds, postnasal drip, sneezing, tinnitus, trouble swallowing and voice change.   ?Respiratory:  Positive for cough. Negative for apnea, choking, chest tightness, shortness of breath, wheezing and stridor.   ?Cardiovascular: Negative.   ?Gastrointestinal: Negative.   ?Skin: Negative.   ?Neurological: Negative.   ? ? ?Physical Exam ?Triage Vital Signs ?  ED Triage Vitals  ?Enc Vitals Group  ?   BP 11/22/21 0927 (!) 145/105  ?   Pulse Rate 11/22/21 0927 94  ?   Resp 11/22/21 0927 16  ?   Temp 11/22/21 0927 98.5 ?F (36.9 ?C)  ?   Temp Source 11/22/21 0927 Oral  ?   SpO2 11/22/21 0927 98 %  ?   Weight 11/22/21 0926 222 lb 0.1 oz (100.7 kg)  ?   Height 11/22/21 0926 '5\' 9"'$  (1.753 m)  ?   Head Circumference --   ?   Peak Flow --   ?   Pain Score 11/22/21 0926 0  ?   Pain Loc --   ?   Pain Edu? --   ?   Excl. in [place]? --   ? ?No data found. ? ?Updated Vital Signs ?BP (!) 145/105 (BP Location: Left Arm)   Pulse 94   Temp 98.5 ?F (36.9 ?C) (Oral)   Resp 16   Ht '5\' 9"'$   (1.753 m)   Wt 222 lb 0.1 oz (100.7 kg)   SpO2 98%   BMI 32.78 kg/m?  ? ?Visual Acuity ?Right Eye Distance:   ?Left Eye Distance:   ?Bilateral Distance:   ? ?Right Eye Near:   ?Left Eye Near:    ?Bilateral Near:    ? ?Physical Exam ?Constitutional:   ?   Appearance: Normal appearance. He is well-developed.  ?HENT:  ?   Head: Normocephalic.  ?   Right Ear: Tympanic membrane and ear canal normal.  ?   Left Ear: Tympanic membrane and ear canal normal.  ?   Nose: Congestion and rhinorrhea present.  ?   Mouth/Throat:  ?   Mouth: Mucous membranes are moist.  ?   Pharynx: Oropharynx is clear.  ?   Tonsils: No tonsillar exudate. 0 on the right. 0 on the left.  ?Eyes:  ?   Extraocular Movements: Extraocular movements intact.  ?Cardiovascular:  ?   Rate and Rhythm: Normal rate and regular rhythm.  ?   Pulses: Normal pulses.  ?   Heart sounds: Normal heart sounds.  ?Pulmonary:  ?   Effort: Pulmonary effort is normal.  ?   Breath sounds: Normal breath sounds.  ?Musculoskeletal:  ?   Cervical back: Normal range of motion and neck supple.  ?Skin: ?   General: Skin is warm and dry.  ?Neurological:  ?   General: No focal deficit present.  ?   Mental Status: He is alert and oriented to person, place, and time.  ?Psychiatric:     ?   Mood and Affect: Mood normal.     ?   Behavior: Behavior normal.  ? ? ? ?UC Treatments / Results  ?Labs ?(all labs ordered are listed, but only abnormal results are displayed) ?Labs Reviewed - No data to display ? ?EKG ? ? ?Radiology ?No results found. ? ?Procedures ?Procedures (including critical care time) ? ?Medications Ordered in UC ?Medications - No data to display ? ?Initial Impression / Assessment and Plan / UC Course  ?I have reviewed the triage vital signs and the nursing notes. ? ?Pertinent labs & imaging results that were available during my care of the patient were reviewed by me and considered in my medical decision making (see chart for details). ? ?Upper respiratory tract  infection ? ?Allergies symptoms are most likely viral but as today is day 6 with no signs of improvement will start bacterial coverage for the upper airways, low  suspicion for pneumonia, bronchitis therefore will defer imaging, discussed with patient, O2 saturation is 98% on room air and lungs are clear to auscultation, prescribed a Z-Pak and Promethazine DM for outpatient management, may continue use of over-the-counter medications for additional support urgent care follow-up as needed ?Final Clinical Impressions(s) / UC Diagnoses  ? ?Final diagnoses:  ?None  ? ?Discharge Instructions   ?None ?  ? ?ED Prescriptions   ?None ?  ? ?PDMP not reviewed this encounter. ?  ?Hans Eden, NP ?11/22/21 1002 ? ?

## 2021-11-22 NOTE — ED Triage Notes (Signed)
Pt reports sore throat, cough, nasal congestion, chest congestion, chills at night since Wednesday night. Home covid test neg. States have tried Mucinex, Sudafed, Delsym, Robitussin,. Tylenol and Ibuprofen with no relief.  ?

## 2021-11-22 NOTE — Discharge Instructions (Addendum)
Your symptoms today are most likely being caused by a virus and should steadily improve in time.  As it has been 6 days with no signs of improvement we will cover for bacteria of the upper airways, I have a low suspicion of more serious condition such as bronchitis or pneumonia at this time ? ?Begin use of azithromycin as directed on packaging ? ?You may use cough syrup every 6 hours as needed for comfort, be mindful this medication may make you drowsy, if this occurs you may take half dosage or to use at bedtime ?   ?You can take Tylenol and/or Ibuprofen as needed for fever reduction and pain relief. ?  ?For cough: honey 1/2 to 1 teaspoon (you can dilute the honey in water or another fluid).  You can also use guaifenesin  for cough. You can use a humidifier for chest congestion and cough.  If you don't have a humidifier, you can sit in the bathroom with the hot shower running.    ?  ?For sore throat: try warm salt water gargles, cepacol lozenges, throat spray, warm tea or water with lemon/honey, popsicles or ice, or OTC cold relief medicine for throat discomfort. ?  ?For congestion: take a daily anti-histamine like Zyrtec, Claritin, and a oral decongestant, such as pseudoephedrine.  You can also use Flonase 1-2 sprays in each nostril daily. ?  ?It is important to stay hydrated: drink plenty of fluids (water, gatorade/powerade/pedialyte, juices, or teas) to keep your throat moisturized and help further relieve irritation/discomfort. ? ?

## 2021-11-25 ENCOUNTER — Other Ambulatory Visit: Payer: Self-pay

## 2021-11-25 ENCOUNTER — Ambulatory Visit: Payer: Managed Care, Other (non HMO) | Admitting: Family Medicine

## 2021-11-25 ENCOUNTER — Encounter: Payer: Self-pay | Admitting: Family Medicine

## 2021-11-25 VITALS — BP 138/94 | HR 80 | Temp 98.4°F | Ht 69.0 in | Wt 240.0 lb

## 2021-11-25 DIAGNOSIS — R051 Acute cough: Secondary | ICD-10-CM

## 2021-11-25 DIAGNOSIS — Z1211 Encounter for screening for malignant neoplasm of colon: Secondary | ICD-10-CM

## 2021-11-25 DIAGNOSIS — J0100 Acute maxillary sinusitis, unspecified: Secondary | ICD-10-CM | POA: Diagnosis not present

## 2021-11-25 MED ORDER — GUAIFENESIN-CODEINE 100-10 MG/5ML PO SYRP: 5.0000 mL | ORAL_SOLUTION | Freq: Three times a day (TID) | ORAL | 0 refills | Status: AC | PRN

## 2021-11-25 MED ORDER — NA SULFATE-K SULFATE-MG SULF 17.5-3.13-1.6 GM/177ML PO SOLN: 1.0000 | Freq: Once | ORAL | 0 refills | Status: AC

## 2021-11-25 NOTE — Progress Notes (Signed)
Date:  11/25/2021   Name:  Michael Wu   DOB:  Dec 28, 1970   MRN:  [id number]   Chief Complaint: Sinusitis (Green, yellow bloody production, head pressure, cough) and Colon Cancer Screening  Sinusitis This is a new problem. The current episode started more than 1 year ago. The problem has been gradually improving since onset. There has been no fever. The pain is moderate. Associated symptoms include congestion, coughing and sinus pressure. Pertinent negatives include no chills, diaphoresis, ear pain, headaches, hoarse voice, neck pain, shortness of breath, sneezing or sore throat. (Prod yellow green) Past treatments include nothing. The treatment provided moderate relief.  Cough This is a new problem. The current episode started in the past 7 days. The problem has been waxing and waning. The cough is Productive of purulent sputum and productive of blood-tinged sputum. Associated symptoms include nasal congestion and rhinorrhea. Pertinent negatives include no chest pain, chills, ear pain, fever, headaches, myalgias, postnasal drip, rash, sore throat, shortness of breath or wheezing. The treatment provided moderate relief. There is no history of environmental allergies.   No results found for: NA, K, CO2, GLUCOSE, BUN, CREATININE, CALCIUM, EGFR, GFRNONAA, GLUCOSE No results found for: CHOL, HDL, LDLCALC, LDLDIRECT, TRIG, CHOLHDL No results found for: TSH No results found for: HGBA1C No results found for: WBC, HGB, HCT, MCV, PLT No results found for: ALT, AST, GGT, ALKPHOS, BILITOT No results found for: 25OHVITD2, 25OHVITD3, VD25OH   Review of Systems  Constitutional:  Negative for chills, diaphoresis and fever.  HENT:  Positive for congestion, rhinorrhea and sinus pressure. Negative for drooling, ear discharge, ear pain, hoarse voice, postnasal drip, sneezing and sore throat.   Respiratory:  Positive for cough. Negative for shortness of breath and wheezing.   Cardiovascular:  Negative  for chest pain, palpitations and leg swelling.  Gastrointestinal:  Negative for abdominal pain, blood in stool, constipation, diarrhea and nausea.  Endocrine: Negative for polydipsia.  Genitourinary:  Negative for dysuria, frequency, hematuria and urgency.  Musculoskeletal:  Negative for back pain, myalgias and neck pain.  Skin:  Negative for rash.  Allergic/Immunologic: Negative for environmental allergies.  Neurological:  Negative for dizziness and headaches.  Hematological:  Does not bruise/bleed easily.  Psychiatric/Behavioral:  Negative for suicidal ideas. The patient is not nervous/anxious.    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    No Known Allergies  Past Surgical History:  Procedure Laterality Date   ANKLE ARTHODESIS W/ ARTHROSCOPY Left    KNEE ARTHROCENTESIS Right    KNEE ARTHROSCOPY WITH MENISCAL REPAIR Right 03/24/2016   Procedure: KNEE ARTHROSCOPY WITH PARTIAL LATERAL MENISECTOMY;  Surgeon: Corky Mull, MD;  Location: [facility] ORS;  Service: Orthopedics;  Laterality: Right;    Social History   Tobacco Use   Smoking status: Never   Smokeless tobacco: Current    Types: Chew  Substance Use Topics   Alcohol use: Yes    Alcohol/week: 1.0 standard drink    Types: 1 Cans of beer per week    Comment: OCC   Drug use: No     Medication list has been reviewed and updated.  Current Meds  Medication Sig   acetaminophen (TYLENOL) 325 MG tablet Take 650 mg by mouth every 6 (six) hours as needed for mild pain.   EPINEPHrine (EPIPEN 2-PAK IJ) Inject 1 Dose as directed as needed.   omeprazole (PRILOSEC OTC) 20 MG tablet Take 20 mg by mouth daily.   [  DISCONTINUED] calcium carbonate (TUMS - DOSED IN MG ELEMENTAL CALCIUM) 500 MG chewable tablet Chew 1 tablet by mouth as needed for indigestion or heartburn.       11/25/2021   10:36 AM  GAD 7 : Generalized Anxiety Score  Nervous, Anxious, on Edge 0  Control/stop worrying 0   Worry too much - different things 0  Trouble relaxing 0  Restless 0  Easily annoyed or irritable 0  Afraid - awful might happen 0  Total GAD 7 Score 0  Anxiety Difficulty Not difficult at all       11/25/2021   10:36 AM  Depression screen PHQ 2/9  Decreased Interest 0  Down, Depressed, Hopeless 0  PHQ - 2 Score 0  Altered sleeping 0  Tired, decreased energy 0  Change in appetite 0  Feeling bad or failure about yourself  0  Trouble concentrating 0  Moving slowly or fidgety/restless 0  Suicidal thoughts 0  PHQ-9 Score 0  Difficult doing work/chores Not difficult at all    BP Readings from Last 3 Encounters:  11/25/21 (!) 138/94  11/22/21 (!) 145/105  01/29/19 130/80    Physical Exam Vitals and nursing note reviewed.  HENT:     Head: Normocephalic.     Right Ear: Tympanic membrane and external ear normal.     Left Ear: Tympanic membrane and external ear normal.     Nose: Nose normal.     Right Sinus: No maxillary sinus tenderness or frontal sinus tenderness.     Left Sinus: No maxillary sinus tenderness or frontal sinus tenderness.  Eyes:     General: No scleral icterus.       Right eye: No discharge.        Left eye: No discharge.     Conjunctiva/sclera: Conjunctivae normal.     Pupils: Pupils are equal, round, and reactive to light.  Neck:     Thyroid: No thyromegaly.     Vascular: No JVD.     Trachea: No tracheal deviation.  Cardiovascular:     Rate and Rhythm: Normal rate and regular rhythm.     Heart sounds: Normal heart sounds. No murmur heard.   No friction rub. No gallop.  Pulmonary:     Effort: No respiratory distress.     Breath sounds: Normal breath sounds. No wheezing, rhonchi or rales.  Abdominal:     General: Bowel sounds are normal.     Palpations: Abdomen is soft. There is no mass.     Tenderness: There is no abdominal tenderness. There is no guarding or rebound.  Musculoskeletal:        General: No tenderness. Normal range of motion.      Cervical back: Normal range of motion and neck supple.  Lymphadenopathy:     Cervical: No cervical adenopathy.  Skin:    General: Skin is warm.     Findings: No rash.  Neurological:     Mental Status: He is alert and oriented to person, place, and time.     Cranial Nerves: No cranial nerve deficit.     Deep Tendon Reflexes: Reflexes are normal and symmetric.    Wt Readings from Last 3 Encounters:  11/25/21 240 lb (108.9 kg)  11/22/21 222 lb 0.1 oz (100.7 kg)  01/29/19 222 lb (100.7 kg)    BP (!) 138/94   Pulse 80   Temp 98.4 F (36.9 C) (Oral)   Ht '5\' 9"'  (1.753 m)   Wt 240 lb (108.9 kg)  BMI 35.44 kg/m   Assessment and Plan:  1. Acute maxillary sinusitis, recurrence not specified New onset.  Persistent.  He was evaluated in urgent care on Sunday and was placed on azithromycin.  Has not gotten significantly better but I pointed out that he is less than halfway through the course of therapy and it would be best to continue this and to further in the course of therapy before changing. - guaiFENesin-codeine (ROBITUSSIN AC) 100-10 MG/5ML syrup; Take 5 mLs by mouth 3 (three) times daily as needed for cough.  Dispense: 118 mL; Refill: 0  2. Acute cough Persistent cough which is not controlled on promethazine with dextromethorphan.  We will give him some Robitussin-AC to use at night and have encouraged him to continue with Mucinex DM during the day. - guaiFENesin-codeine (ROBITUSSIN AC) 100-10 MG/5ML syrup; Take 5 mLs by mouth 3 (three) times daily as needed for cough.  Dispense: 118 mL; Refill: 0  3. Colon cancer screening Discussed and this appointment has been made with gastroenterology. - Ambulatory referral to Gastroenterology

## 2021-11-25 NOTE — Progress Notes (Signed)
Gastroenterology Pre-Procedure Review  Request Date: 02/11/2022 Requesting Physician: Dr. Allen Norris  PATIENT REVIEW QUESTIONS: The patient responded to the following health history questions as indicated:    1. Are you having any GI issues? no 2. Do you have a personal history of Polyps? no 3. Do you have a family history of Colon Cancer or Polyps? no 4. Diabetes Mellitus? no 5. Joint replacements in the past 12 months?no 6. Major health problems in the past 3 months?no 7. Any artificial heart valves, MVP, or defibrillator?no    MEDICATIONS & ALLERGIES:    Patient reports the following regarding taking any anticoagulation/antiplatelet therapy:   Plavix, Coumadin, Eliquis, Xarelto, Lovenox, Pradaxa, Brilinta, or Effient? no Aspirin? no  Patient confirms/reports the following medications:  Current Outpatient Medications  Medication Sig Dispense Refill   albuterol (PROVENTIL HFA) 108 (90 Base) MCG/ACT inhaler INHALE 1 TO 2 PUFFS BY MOUTH EVERY 6 HOURS AS NEEDED FOR WHEEZING     chlorpheniramine-HYDROcodone 10-8 MG/5ML TAKE 5 ML BY MOUTH NIGHTLY AS NEEDED FOR COUGH     montelukast (SINGULAIR) 10 MG tablet Take 1 tablet by mouth at bedtime.     acetaminophen (TYLENOL) 325 MG tablet Take 650 mg by mouth every 6 (six) hours as needed for mild pain.     EPINEPHrine (EPIPEN 2-PAK IJ) Inject 1 Dose as directed as needed.     guaiFENesin-codeine (ROBITUSSIN AC) 100-10 MG/5ML syrup Take 5 mLs by mouth 3 (three) times daily as needed for cough. 118 mL 0   omeprazole (PRILOSEC OTC) 20 MG tablet Take 20 mg by mouth daily.     No current facility-administered medications for this visit.    Patient confirms/reports the following allergies:  No Known Allergies  No orders of the defined types were placed in this encounter.   AUTHORIZATION INFORMATION Primary Insurance: 1D#: Group #:  Secondary Insurance: 1D#: Group #:  SCHEDULE INFORMATION: Date: 02/11/2022 Time: Location: [facility]

## 2022-02-10 ENCOUNTER — Encounter: Payer: Self-pay | Admitting: Gastroenterology

## 2022-02-11 ENCOUNTER — Encounter
Admission: RE | Disposition: A | Discharge status: Home / Self Care | Payer: Self-pay | Source: Home / Self Care | Attending: Gastroenterology

## 2022-02-11 ENCOUNTER — Ambulatory Visit
Admission: RE | Admit: 2022-02-11 | Discharge: 2022-02-11 | Disposition: A | Discharge status: Home / Self Care | Payer: Managed Care, Other (non HMO) | Attending: Gastroenterology | Admitting: Gastroenterology

## 2022-02-11 ENCOUNTER — Ambulatory Visit: Payer: Managed Care, Other (non HMO) | Admitting: Anesthesiology

## 2022-02-11 DIAGNOSIS — Z1211 Encounter for screening for malignant neoplasm of colon: Secondary | ICD-10-CM | POA: Diagnosis present

## 2022-02-11 DIAGNOSIS — K5730 Diverticulosis of large intestine without perforation or abscess without bleeding: Secondary | ICD-10-CM | POA: Insufficient documentation

## 2022-02-11 DIAGNOSIS — K219 Gastro-esophageal reflux disease without esophagitis: Secondary | ICD-10-CM | POA: Insufficient documentation

## 2022-02-11 DIAGNOSIS — D128 Benign neoplasm of rectum: Secondary | ICD-10-CM | POA: Diagnosis not present

## 2022-02-11 DIAGNOSIS — D124 Benign neoplasm of descending colon: Secondary | ICD-10-CM | POA: Diagnosis not present

## 2022-02-11 DIAGNOSIS — K635 Polyp of colon: Secondary | ICD-10-CM

## 2022-02-11 DIAGNOSIS — D125 Benign neoplasm of sigmoid colon: Secondary | ICD-10-CM | POA: Insufficient documentation

## 2022-02-11 HISTORY — PX: COLONOSCOPY WITH PROPOFOL: SHX5780

## 2022-02-11 SURGERY — COLONOSCOPY WITH PROPOFOL
Anesthesia: General

## 2022-02-11 MED ORDER — PROPOFOL 10 MG/ML IV BOLUS
INTRAVENOUS | Status: AC
  Filled 2022-02-11: qty 40

## 2022-02-11 MED ORDER — LIDOCAINE HCL (PF) 2 % IJ SOLN
INTRAMUSCULAR | Status: AC
  Filled 2022-02-11: qty 5

## 2022-02-11 MED ORDER — PROPOFOL 1000 MG/100ML IV EMUL
INTRAVENOUS | Status: AC
  Filled 2022-02-11: qty 200

## 2022-02-11 MED ORDER — PROPOFOL 10 MG/ML IV BOLUS
INTRAVENOUS | Status: DC | PRN
  Administered 2022-02-11: 40 mg via INTRAVENOUS
  Administered 2022-02-11: 120 mg via INTRAVENOUS
  Administered 2022-02-11: 40 mg via INTRAVENOUS
  Administered 2022-02-11: 80 mg via INTRAVENOUS
  Administered 2022-02-11 (×2): 40 mg via INTRAVENOUS

## 2022-02-11 MED ORDER — SODIUM CHLORIDE 0.9 % IV SOLN
INTRAVENOUS | Status: DC
  Administered 2022-02-11: 20 mL/h via INTRAVENOUS

## 2022-02-11 MED ORDER — PROPOFOL 1000 MG/100ML IV EMUL
INTRAVENOUS | Status: AC
  Filled 2022-02-11: qty 100

## 2022-02-11 NOTE — Anesthesia Preprocedure Evaluation (Signed)
Anesthesia Evaluation  Patient identified by MRN, date of birth, ID band Patient awake    Reviewed: Allergy & Precautions, NPO status , Patient's Chart, lab work & pertinent test results  Airway Mallampati: II  TM Distance: >3 FB Neck ROM: Full    Dental  (+) Teeth Intact   Pulmonary neg pulmonary ROS,    Pulmonary exam normal breath sounds clear to auscultation       Cardiovascular Exercise Tolerance: Good negative cardio ROS Normal cardiovascular exam Rhythm:Regular     Neuro/Psych  Headaches, negative neurological ROS  negative psych ROS   GI/Hepatic negative GI ROS, Neg liver ROS, GERD  ,  Endo/Other  negative endocrine ROS  Renal/GU negative Renal ROS  negative genitourinary   Musculoskeletal   Abdominal Normal abdominal exam  (+)   Peds negative pediatric ROS (+)  Hematology negative hematology ROS (+)   Anesthesia Other Findings Past Medical History: No date: Complication of anesthesia     Comment:  PT HAD AN ANAPHYLAXIS REACTION IN 2008 AFTER SURGERY AT               HOME THE NEXT DAY-PT UNSURE OF THE CAUSE BUT WAS TREATED               WITH STEROIDS AND BENADRYL AND DC'D HOME THE SAME DAY No date: GERD (gastroesophageal reflux disease)     Comment:  RARE-TUMS PRN No date: Headache     Comment:  H/O MIGRAINES  Past Surgical History: No date: ANKLE ARTHODESIS W/ ARTHROSCOPY; Left No date: KNEE ARTHROCENTESIS; Right 03/24/2016: KNEE ARTHROSCOPY WITH MENISCAL REPAIR; Right     Comment:  Procedure: KNEE ARTHROSCOPY WITH PARTIAL LATERAL               MENISECTOMY;  Surgeon: Corky Mull, MD;  Location: [facility]               ORS;  Service: Orthopedics;  Laterality: Right;  BMI    Body Mass Index: 33.23 kg/m      Reproductive/Obstetrics negative OB ROS                             Anesthesia Physical Anesthesia Plan  ASA: 2  Anesthesia Plan: General   Post-op Pain  Management:    Induction: Intravenous  PONV Risk Score and Plan: Propofol infusion and TIVA  Airway Management Planned: Natural Airway  Additional Equipment:   Intra-op Plan:   Post-operative Plan:   Informed Consent: I have reviewed the patients History and Physical, chart, labs and discussed the procedure including the risks, benefits and alternatives for the proposed anesthesia with the patient or authorized representative who has indicated his/her understanding and acceptance.     Dental Advisory Given  Plan Discussed with: CRNA and Surgeon  Anesthesia Plan Comments:         Anesthesia Quick Evaluation

## 2022-02-11 NOTE — Transfer of Care (Signed)
Immediate Anesthesia Transfer of Care Note  Patient: Michael Wu  Procedure(s) Performed: COLONOSCOPY WITH PROPOFOL  Patient Location: Endoscopy Unit  Anesthesia Type:General  Level of Consciousness: drowsy  Airway & Oxygen Therapy: Patient Spontanous Breathing and Patient connected to nasal cannula oxygen  Post-op Assessment: Report given to RN, Post -op Vital signs reviewed and stable and Patient moving all extremities  Post vital signs: Reviewed and stable  Last Vitals:  Vitals Value Taken Time  BP 115/80 02/11/22 0754  Temp 35.7 C 02/11/22 0753  Pulse 84 02/11/22 0753  Resp 12 02/11/22 0753  SpO2 95 % 02/11/22 0753  Vitals shown include unvalidated device data.  Last Pain:  Vitals:   02/11/22 0753  TempSrc: Temporal  PainSc: Asleep         Complications: No notable events documented.

## 2022-02-11 NOTE — H&P (Signed)
Lucilla Lame, MD [address] [place], [place] [address] Phone: [phone] Fax : [phone]  Primary Care Physician:  Juline Patch, MD Primary Gastroenterologist:  Dr. Allen Norris  Pre-Procedure History & Physical: HPI:  Michael Wu is a 51 y.o. male is here for a screening colonoscopy.   Past Medical History:  Diagnosis Date   Complication of anesthesia    PT HAD AN ANAPHYLAXIS REACTION IN 2008 AFTER SURGERY AT HOME THE NEXT DAY-PT UNSURE OF THE CAUSE BUT WAS TREATED WITH STEROIDS AND BENADRYL AND DC'D HOME THE SAME DAY   GERD (gastroesophageal reflux disease)    RARE-TUMS PRN   Headache    H/O MIGRAINES    Past Surgical History:  Procedure Laterality Date   ANKLE ARTHODESIS W/ ARTHROSCOPY Left    KNEE ARTHROCENTESIS Right    KNEE ARTHROSCOPY WITH MENISCAL REPAIR Right 03/24/2016   Procedure: KNEE ARTHROSCOPY WITH PARTIAL LATERAL MENISECTOMY;  Surgeon: Corky Mull, MD;  Location: [facility] ORS;  Service: Orthopedics;  Laterality: Right;    Prior to Admission medications   Medication Sig Start Date End Date Taking? Authorizing Provider  acetaminophen (TYLENOL) 325 MG tablet Take 650 mg by mouth every 6 (six) hours as needed for mild pain.   Yes [provider]  albuterol (PROVENTIL HFA) 108 (90 Base) MCG/ACT inhaler INHALE 1 TO 2 PUFFS BY MOUTH EVERY 6 HOURS AS NEEDED FOR WHEEZING 01/21/15  Yes [provider]  chlorpheniramine-HYDROcodone 10-8 MG/5ML TAKE 5 ML BY MOUTH NIGHTLY AS NEEDED FOR COUGH 01/21/15  Yes [provider]  EPINEPHrine (EPIPEN 2-PAK IJ) Inject 1 Dose as directed as needed.   Yes [provider]  guaiFENesin-codeine (ROBITUSSIN AC) 100-10 MG/5ML syrup Take 5 mLs by mouth 3 (three) times daily as needed for cough. 11/25/21  Yes Juline Patch, MD  montelukast (SINGULAIR) 10 MG tablet Take 1 tablet by mouth at bedtime. 02/09/16  Yes [provider]  omeprazole (PRILOSEC OTC) 20 MG tablet Take 20 mg by  mouth daily.   Yes [provider]    Allergies as of 11/25/2021   (No Known Allergies)    Family History  Problem Relation Age of Onset   Cancer Father    Heart disease Father     Social History   Socioeconomic History   Marital status: Married    Spouse name: Not on file   Number of children: Not on file   Years of education: Not on file   Highest education level: Not on file  Occupational History   Not on file  Tobacco Use   Smoking status: Never   Smokeless tobacco: Current    Types: Chew  Vaping Use   Vaping Use: Never used  Substance and Sexual Activity   Alcohol use: Yes    Alcohol/week: 1.0 standard drink of alcohol    Types: 1 Cans of beer per week    Comment: OCC   Drug use: No   Sexual activity: Yes  Other Topics Concern   Not on file  Social History Narrative   Not on file   Social Determinants of Health   Financial Resource Strain: Not on file  Food Insecurity: Not on file  Transportation Needs: Not on file  Physical Activity: Not on file  Stress: Not on file  Social Connections: Not on file  Intimate Partner Violence: Not on file    Review of Systems: See HPI, otherwise negative ROS  Physical Exam: BP (!) 140/100  Pulse 80   Temp (!) 96 F (35.6 C) (Temporal)   Resp 20   Ht '5\' 9"'$  (1.753 m)   Wt 102.1 kg   SpO2 100%   BMI 33.23 kg/m  General:   Alert,  pleasant and cooperative in NAD Head:  Normocephalic and atraumatic. Neck:  Supple; no masses or thyromegaly. Lungs:  Clear throughout to auscultation.    Heart:  Regular rate and rhythm. Abdomen:  Soft, nontender and nondistended. Normal bowel sounds, without guarding, and without rebound.   Neurologic:  Alert and  oriented x4;  grossly normal neurologically.  Impression/Plan: Michael Wu is now here to undergo a screening colonoscopy.  Risks, benefits, and alternatives regarding colonoscopy have been reviewed with the patient.  Questions have been answered.  All  parties agreeable.

## 2022-02-11 NOTE — Anesthesia Postprocedure Evaluation (Signed)
Anesthesia Post Note  Patient: Michael Wu  Procedure(s) Performed: COLONOSCOPY WITH PROPOFOL  Patient location during evaluation: PACU Anesthesia Type: General Level of consciousness: awake and awake and alert Pain management: satisfactory to patient Vital Signs Assessment: post-procedure vital signs reviewed and stable Respiratory status: spontaneous breathing and respiratory function stable Cardiovascular status: stable Anesthetic complications: no   No notable events documented.   Last Vitals:  Vitals:   02/11/22 0803 02/11/22 0813  BP: 115/81 118/87  Pulse:    Resp:    Temp:    SpO2:      Last Pain:  Vitals:   02/11/22 0813  TempSrc:   PainSc: 0-No pain                 VAN STAVEREN,Claborn Janusz

## 2022-02-11 NOTE — Op Note (Signed)
[facility] Gastroenterology Patient Name: Michael Wu Procedure Date: 02/11/2022 7:33 AM MRN: [id number] Account #: [id number] Date of Birth: 09/07/1970 Admit Type: Outpatient Age: 51 Room: [facility] ENDO ROOM 4 Gender: Male Note Status: Finalized Instrument Name: Park Meo [id number] Procedure:             Colonoscopy Indications:           Screening for colorectal malignant neoplasm Providers:             Lucilla Lame MD, MD Medicines:             Propofol per Anesthesia Complications:         No immediate complications. Procedure:             Pre-Anesthesia Assessment:                        - Prior to the procedure, a History and Physical was                         performed, and patient medications and allergies were                         reviewed. The patient's tolerance of previous                         anesthesia was also reviewed. The risks and benefits                         of the procedure and the sedation options and risks                         were discussed with the patient. All questions were                         answered, and informed consent was obtained. Prior                         Anticoagulants: The patient has taken no previous                         anticoagulant or antiplatelet agents. ASA Grade                         Assessment: II - A patient with mild systemic disease.                         After reviewing the risks and benefits, the patient                         was deemed in satisfactory condition to undergo the                         procedure.                        After obtaining informed consent, the colonoscope was                         passed under direct vision. Throughout the procedure,  the patient's blood pressure, pulse, and oxygen                         saturations were monitored continuously. The                         Colonoscope was introduced through the anus and                          advanced to the the cecum, identified by appendiceal                         orifice and ileocecal valve. The colonoscopy was                         performed without difficulty. The patient tolerated                         the procedure well. The quality of the bowel                         preparation was excellent. Findings:      The perianal and digital rectal examinations were normal.      A 4 mm polyp was found in the rectum. The polyp was sessile. The polyp       was removed with a cold snare. Resection and retrieval were complete.      A 4 mm polyp was found in the sigmoid colon. The polyp was sessile. The       polyp was removed with a cold snare. Resection and retrieval were       complete.      A 4 mm polyp was found in the descending colon. The polyp was sessile.       The polyp was removed with a cold snare. Resection and retrieval were       complete.      A few small-mouthed diverticula were found in the entire colon. Impression:            - One 4 mm polyp in the rectum, removed with a cold                         snare. Resected and retrieved.                        - One 4 mm polyp in the sigmoid colon, removed with a                         cold snare. Resected and retrieved.                        - One 4 mm polyp in the descending colon, removed with                         a cold snare. Resected and retrieved.                        - Diverticulosis in the entire examined colon. Recommendation:        - Discharge patient to home.                        -  Resume previous diet.                        - Continue present medications.                        - Await pathology results.                        - If the pathology report reveals adenomatous tissue,                         then repeat the colonoscopy for surveillance in 5                         years. Procedure Code(s):     --- Professional ---                        [phone], Colonoscopy, flexible;  with removal of                         tumor(s), polyp(s), or other lesion(s) by snare                         technique Diagnosis Code(s):     --- Professional ---                        K62.1, Rectal polyp                        K63.5, Polyp of colon CPT copyright 2019 [employer]. All rights reserved. The codes documented in this report are preliminary and upon coder review may  be revised to meet current compliance requirements. Lucilla Lame MD, MD 02/11/2022 7:53:19 AM This report has been signed electronically. Number of Addenda: 0 Note Initiated On: 02/11/2022 7:33 AM Scope Withdrawal Time: 0 hours 8 minutes 28 seconds  Total Procedure Duration: 0 hours 12 minutes 51 seconds  Estimated Blood Loss:  Estimated blood loss: none.      [facility]

## 2022-02-14 ENCOUNTER — Encounter: Payer: Self-pay | Admitting: Gastroenterology

## 2022-02-14 LAB — SURGICAL PATHOLOGY
